# Patient Record
Sex: Male | Born: 1960 | Race: White | Hispanic: No | Marital: Married | Smoking: Never smoker
Health system: Southern US, Community
[De-identification: ages and names within clinical notes are randomized; demographics above are authoritative.]

## PROBLEM LIST (undated history)

## (undated) DIAGNOSIS — I10 Essential (primary) hypertension: Secondary | ICD-10-CM

## (undated) DIAGNOSIS — M1990 Unspecified osteoarthritis, unspecified site: Secondary | ICD-10-CM

## (undated) DIAGNOSIS — Z87442 Personal history of urinary calculi: Secondary | ICD-10-CM

## (undated) DIAGNOSIS — G4733 Obstructive sleep apnea (adult) (pediatric): Secondary | ICD-10-CM

## (undated) DIAGNOSIS — G4730 Sleep apnea, unspecified: Secondary | ICD-10-CM

## (undated) DIAGNOSIS — N200 Calculus of kidney: Secondary | ICD-10-CM

## (undated) DIAGNOSIS — K219 Gastro-esophageal reflux disease without esophagitis: Secondary | ICD-10-CM

## (undated) DIAGNOSIS — R931 Abnormal findings on diagnostic imaging of heart and coronary circulation: Secondary | ICD-10-CM

## (undated) HISTORY — PX: TONSILLECTOMY: SUR1361

## (undated) HISTORY — DX: Abnormal findings on diagnostic imaging of heart and coronary circulation: R93.1

## (undated) HISTORY — DX: Obstructive sleep apnea (adult) (pediatric): G47.33

## (undated) HISTORY — DX: Calculus of kidney: N20.0

---

## 2000-02-29 ENCOUNTER — Encounter: Admission: RE | Admit: 2000-02-29 | Discharge: 2000-02-29 | Payer: Self-pay | Admitting: Specialist

## 2000-02-29 ENCOUNTER — Encounter: Payer: Self-pay | Admitting: Specialist

## 2000-08-23 ENCOUNTER — Encounter: Payer: Self-pay | Admitting: Emergency Medicine

## 2000-08-23 ENCOUNTER — Emergency Department (HOSPITAL_COMMUNITY): Admission: EM | Admit: 2000-08-23 | Discharge: 2000-08-23 | Payer: Self-pay | Admitting: Emergency Medicine

## 2000-12-01 HISTORY — PX: OTHER SURGICAL HISTORY: SHX169

## 2000-12-16 ENCOUNTER — Inpatient Hospital Stay (HOSPITAL_COMMUNITY): Admission: RE | Admit: 2000-12-16 | Discharge: 2000-12-17 | Payer: Self-pay | Admitting: Orthopaedic Surgery

## 2010-12-01 ENCOUNTER — Emergency Department (HOSPITAL_COMMUNITY)
Admission: EM | Admit: 2010-12-01 | Discharge: 2010-12-01 | Payer: Self-pay | Source: Home / Self Care | Admitting: Emergency Medicine

## 2010-12-01 IMAGING — CT CT ABD-PELV W/O CM
2 of 4 series · 17 of 46 positions shown, 19 images · non-contrast
Comparison: None.

CLINICAL DATA: Left flank pain.  Radiates to the groin.  Nausea
vomiting.  History of kidney stones.

CT ABDOMEN AND PELVIS WITHOUT CONTRAST
TECHNIQUE: Multidetector CT imaging of the abdomen and pelvis was
performed following the standard protocol without intravenous
contrast.

[Series 2: stone_wo 5.0 b40f st · axial · 0.81mm/px · z∈[-543,-113]mm · 14 of 94 slices shown, 16 images]
[im 4/94  soft-tissue]
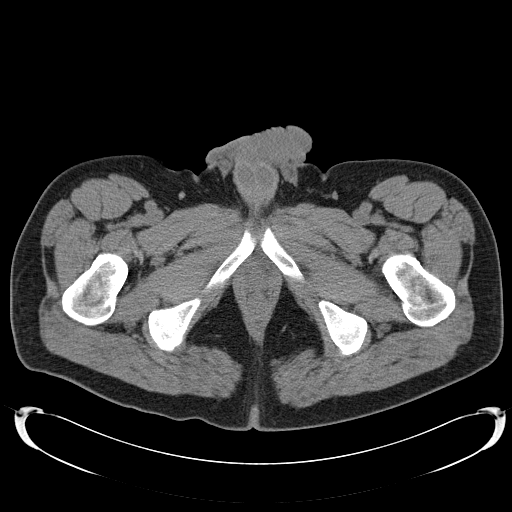
[im 4/94  bone]
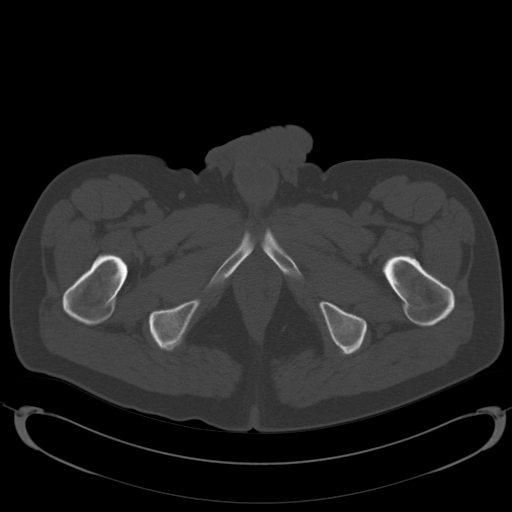
[im 12/94  soft-tissue]
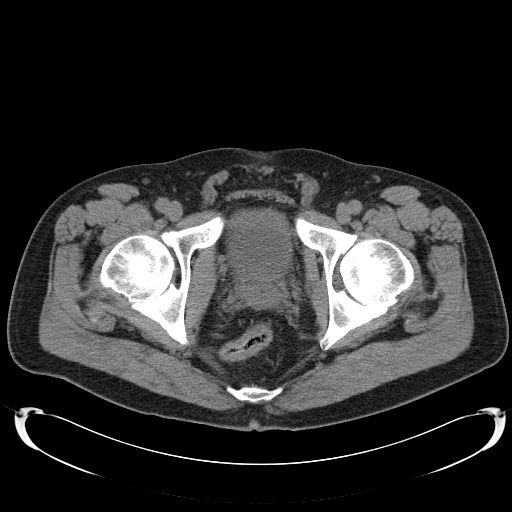
[im 20/94  soft-tissue]
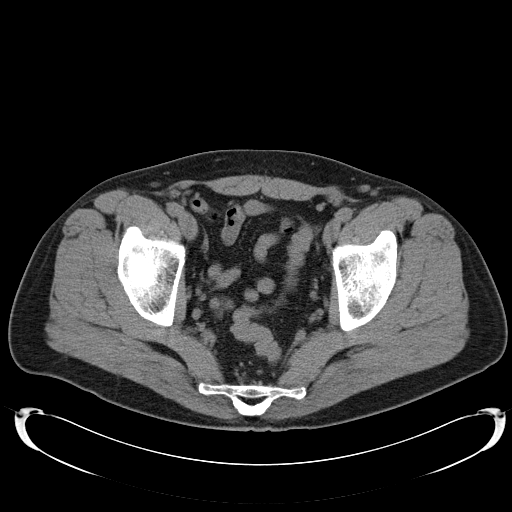
[im 24/94  soft-tissue]
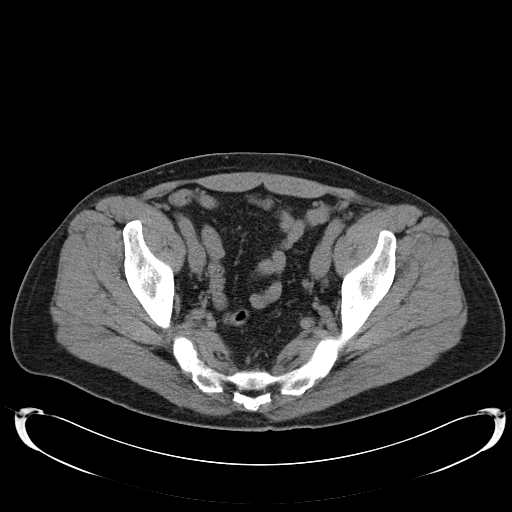
[im 32/94  soft-tissue]
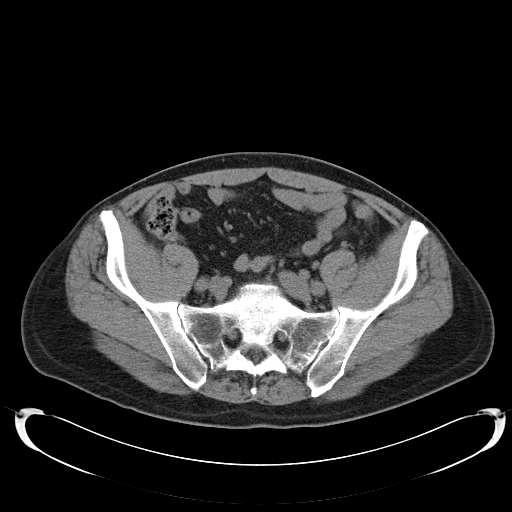
[im 39/94  soft-tissue]
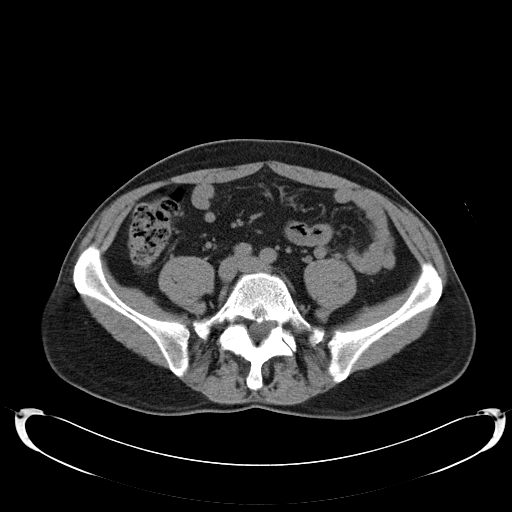
[im 43/94  soft-tissue]
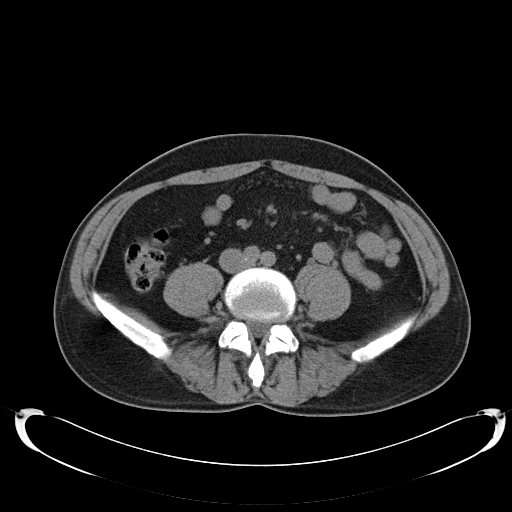
[im 51/94  soft-tissue]
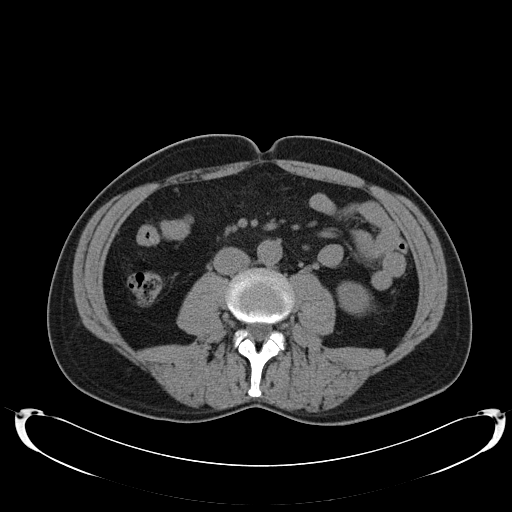
[im 55/94  soft-tissue]
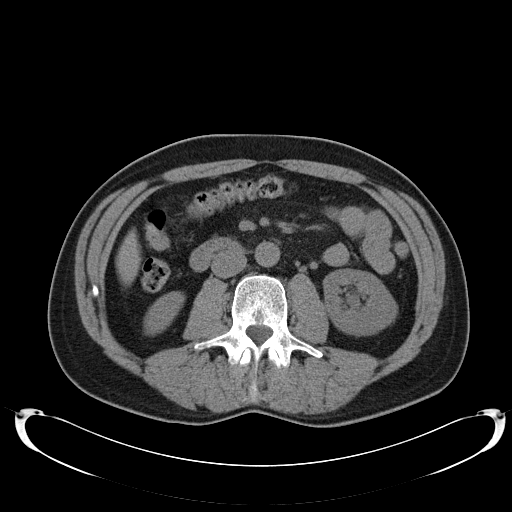
[im 55/94  bone]
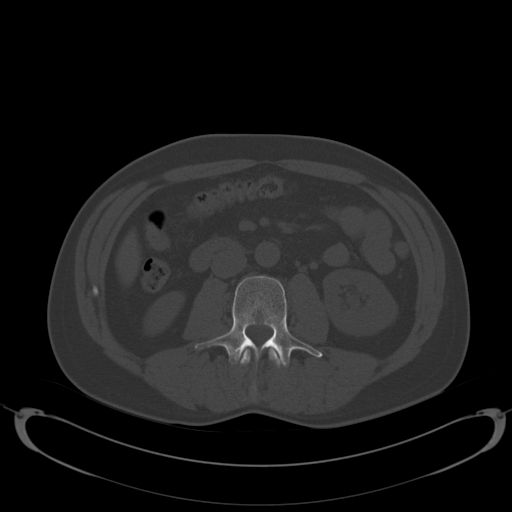
[im 63/94  soft-tissue]
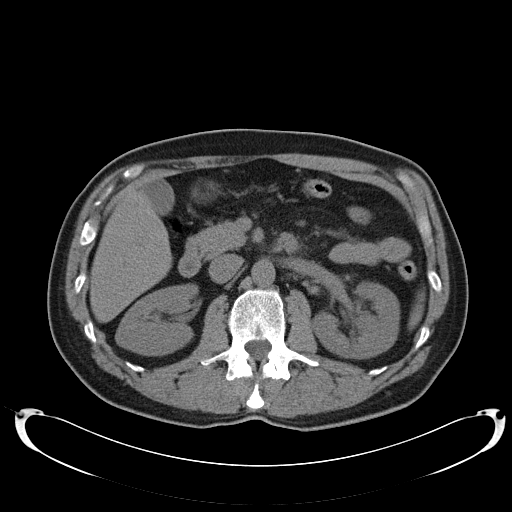
[im 70/94  soft-tissue]
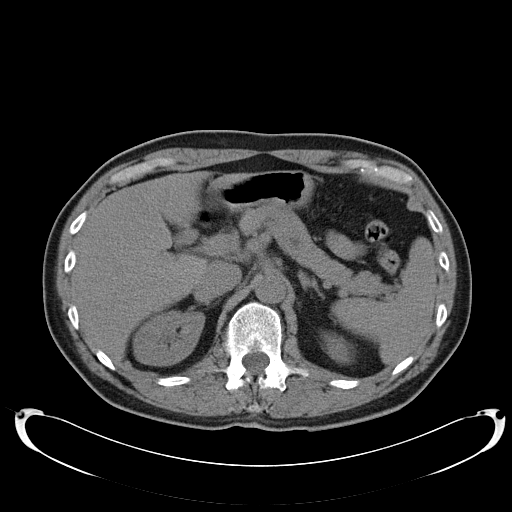
[im 74/94  soft-tissue]
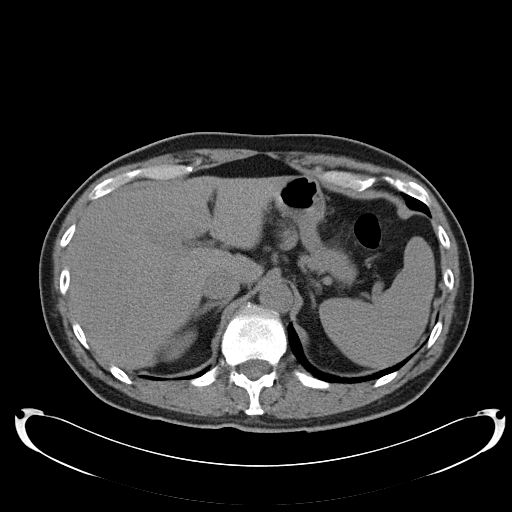
[im 82/94  soft-tissue]
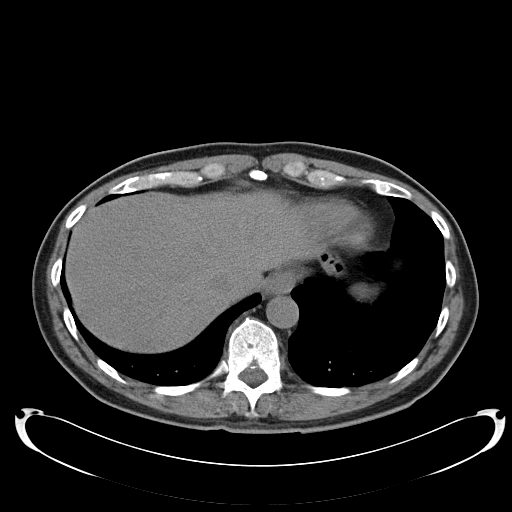
[im 90/94  soft-tissue]
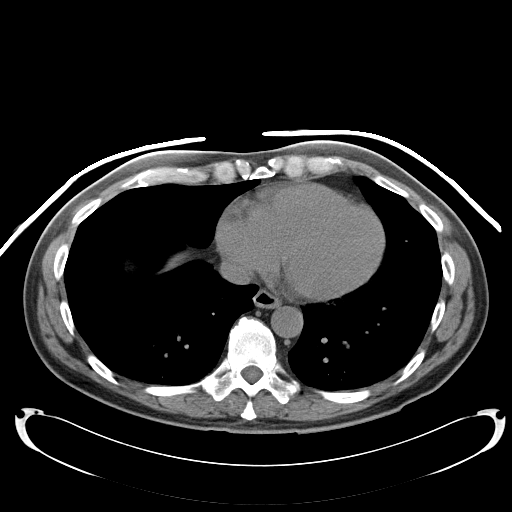

[Series 602: coronal abdomen · coronal · 0.95mm/px · 3 of 120 slices shown]
[im 40/120  soft-tissue]
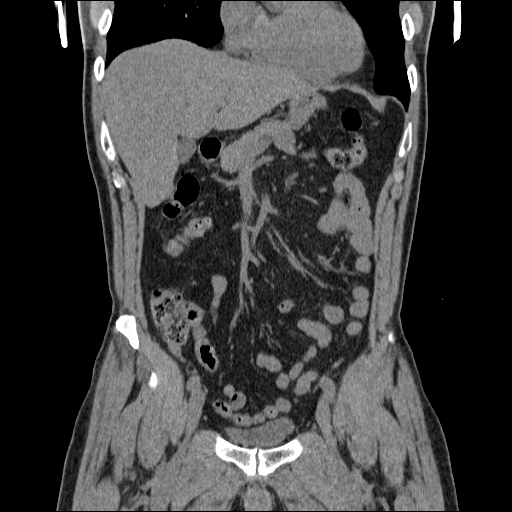
[im 53/120  soft-tissue]
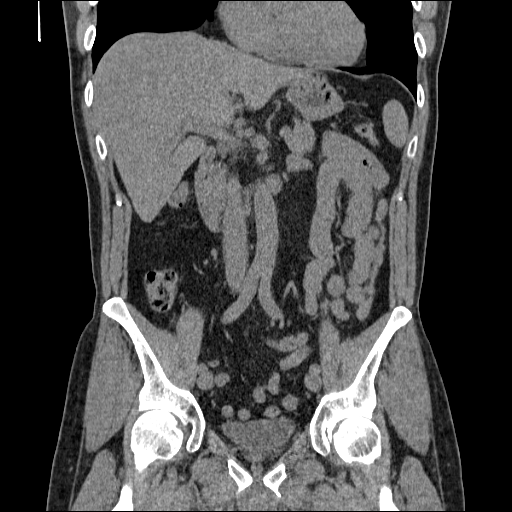
[im 67/120  soft-tissue]
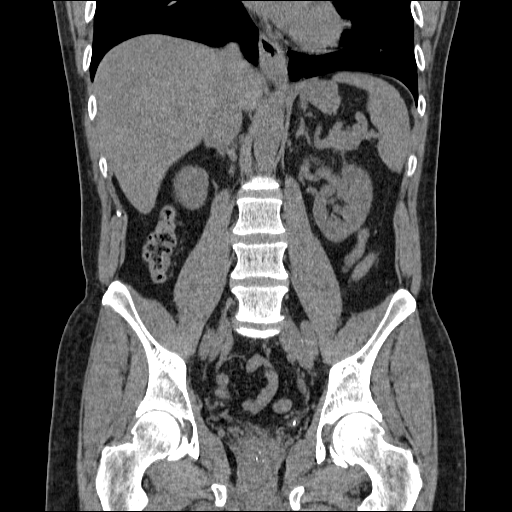

[17 of 46 positions shown; findings below may reference images not displayed]

FINDINGS: No focal abnormalities seen in the liver or spleen.  The
stomach, duodenum, pancreas, gallbladder, adrenal glands, and right
kidney are unremarkable.  Fullness is seen in the left intrarenal
collecting system and proximal left ureter.

No abdominal aortic aneurysm.  No free fluid in the abdomen.  No
evidence for abdominal lymphadenopathy.

Imaging through the pelvis shows a 3-4 mm stone in the distal left
ureter about 1.0 cm proximal to the UVJ.  No stones in the right
ureter.  No bladder stones.

There is no free fluid in the pelvis.  There is no pelvic sidewall
lymphadenopathy.  Prostate gland is mildly enlarged.  No
substantial diverticular change in the colon.  No evidence for
colonic diverticulitis.  The terminal ileum is normal.  The
appendix is normal.

Bone windows revealed no worrisome lytic or sclerotic bony
abnormality.
IMPRESSION: 3-4 mm distal left ureteral stone causes mild secondary changes in
the left kidney and ureter.

## 2011-02-10 LAB — URINE MICROSCOPIC-ADD ON

## 2011-02-10 LAB — URINALYSIS, ROUTINE W REFLEX MICROSCOPIC
Bilirubin Urine: NEGATIVE
Glucose, UA: NEGATIVE mg/dL
Ketones, ur: NEGATIVE mg/dL
Leukocytes, UA: NEGATIVE
Nitrite: NEGATIVE
Protein, ur: NEGATIVE mg/dL
Specific Gravity, Urine: 1.019 (ref 1.005–1.030)
Urobilinogen, UA: 0.2 mg/dL (ref 0.0–1.0)
pH: 5.5 (ref 5.0–8.0)

## 2011-02-10 LAB — DIFFERENTIAL
Basophils Absolute: 0 10*3/uL (ref 0.0–0.1)
Eosinophils Relative: 2 % (ref 0–5)
Lymphocytes Relative: 40 % (ref 12–46)
Lymphs Abs: 2.6 10*3/uL (ref 0.7–4.0)
Neutro Abs: 3 10*3/uL (ref 1.7–7.7)

## 2011-02-10 LAB — BASIC METABOLIC PANEL
Chloride: 103 mEq/L (ref 96–112)
Creatinine, Ser: 1.14 mg/dL (ref 0.4–1.5)
GFR calc Af Amer: >60 mL/min (ref >60)
GFR calc non Af Amer: >60 mL/min (ref >60)
Potassium: 3.8 mEq/L (ref 3.5–5.1)

## 2011-02-10 LAB — CBC
HCT: 44.2 % (ref 39.0–52.0)
MCV: 91.9 fL (ref 78.0–100.0)
Platelets: 203 10*3/uL (ref 150–400)
RBC: 4.81 MIL/uL (ref 4.22–5.81)
RDW: 11.6 % (ref 11.5–15.5)
WBC: 6.3 10*3/uL (ref 4.0–10.5)

## 2011-04-18 NOTE — Op Note (Signed)
[place]  Patient:    Bruce [name], Bruce [name]                        MRN: [id number] Proc. Date: 12/16/00 Adm. Date:  [id number] Disc. Date: [id number] Attending:  Shelba Flake                           Operative Report  PREOPERATIVE DIAGNOSIS:  L5-S1 herniated nucleus pulposus right.  POSTOPERATIVE DIAGNOSIS:  L5-S1 herniated nucleus pulposus right.  PROCEDURE:  Right L5 laminotomy and L5-S1 microdiskectomy.  SURGEON:  Mark C. Ophelia Charter, M.D.  ASSISTANT:  Colon Flattery. Ollen Bowl, M.D.  ANESTHESIA:  GOT.  DESCRIPTION OF PROCEDURE:  After induction of general anesthesia, orotracheal intubation, the patient was placed in the kneeling position on the [place] frame.  The back was prepped with Duraprep, and preoperative Ancef was given prophylactically.  After squaring the area with towels, Betadine Vi-Drape, laminectomy sheets, and drapes, a crosstable lateral x-ray confirmed that the needle was directly over the L5-S1 disk space.  Small incision was made just to the right of the midline adjacent to the spinous processes.  Subperiosteal dissection down to the interspinous space between the right L5 hemilamina and the S1 lamina was performed.  Taylor retractor was placed laterally with a five-pound weight.  Soft tissue was cleaned off with a cup curette. Ligamentum was incised with a 15 scalpel blade.  Ligamentum was removed.  Dura was then visualized.  Operative microscope was brought in.  The ligament was cleared from the lateral gutter.  Foramen was enlarged.  There was a disk that had a small amount of herniated nucleus material adjacent to an area of calcified disk, which was just to the right and on the edge of the shoulder of the nerve root.  The nerve root was gently retracted, and then the small puncture site with the extruded piece of disk was enlarged slightly and a moderate amount of nucleus disk material was removed.  The calcified fragment was  partially excised with a scalpel and then had to be totally removed with a medium 3 mm and 4 mm Kerrison.  Palpation showed no remaining areas of calcification of annulus or old extruded disk fragment.  Passes were made toward the midline with up- and straight pituitaries, down-biting pituitaries. The foramen was enlarged.  The nerve root was completely loose, and a hockey stick could be passed anterior to the dura and nerve root 180 degree sweep with no areas of compression.  The axilla of the nerve root was inspected and was loose and free and after irrigation with saline solution into the disk, the fascia was closed with 0 Vicryl, 2-0 Vicryl in the subcutaneous tissue, Marcaine infiltration in the skin, skin staple closure, and a postoperative dressing was applied, and instrument count and needle count was correct. DD:  12/16/00 TD:  12/16/00 Job: [id number]

## 2013-04-26 ENCOUNTER — Ambulatory Visit (INDEPENDENT_AMBULATORY_CARE_PROVIDER_SITE_OTHER): Payer: BC Managed Care – PPO | Admitting: Cardiovascular Disease

## 2013-04-26 ENCOUNTER — Encounter: Payer: Self-pay | Admitting: Cardiovascular Disease

## 2013-04-26 VITALS — BP 126/92 | HR 59 | Ht 77.0 in | Wt 217.1 lb

## 2013-04-26 DIAGNOSIS — E782 Mixed hyperlipidemia: Secondary | ICD-10-CM

## 2013-04-26 DIAGNOSIS — R5381 Other malaise: Secondary | ICD-10-CM

## 2013-04-26 DIAGNOSIS — R5383 Other fatigue: Secondary | ICD-10-CM

## 2013-04-26 DIAGNOSIS — E785 Hyperlipidemia, unspecified: Secondary | ICD-10-CM

## 2013-04-26 DIAGNOSIS — R0683 Snoring: Secondary | ICD-10-CM | POA: Insufficient documentation

## 2013-04-26 DIAGNOSIS — Z8249 Family history of ischemic heart disease and other diseases of the circulatory system: Secondary | ICD-10-CM

## 2013-04-26 DIAGNOSIS — R0989 Other specified symptoms and signs involving the circulatory and respiratory systems: Secondary | ICD-10-CM

## 2013-04-26 DIAGNOSIS — Z13828 Encounter for screening for other musculoskeletal disorder: Secondary | ICD-10-CM

## 2013-04-26 NOTE — Progress Notes (Signed)
Patient ID: Bruce [name], male   DOB: November 08, 1961, 52 y.o.   MRN: [id number] PATIENT PROFILE: Bruce [name] is a 52 year old male whose mother suffered a myocardial infarction one month ago. Patient has a significant family history for premature coronary artery disease. The patient now presents for establishment of cardiology care and screening assessment.   HPI: Bruce [name] denies any known coronary artery disease or episodes of chest pain. He states that over the years he has had slight progression of his cholesterol and has consistently had low good cholesterol levels. He is followed by Dr. Judithann Sauger and his last laboratory August 2013 showed a total cholesterol 178, triglycerides 196, HDL cholesterol 33, non-HDL cholesterol 145 and LDL cholesterol 113. He is never been on lipid lowering therapy and currently does not take medications with the exception of aspirin and over-the-counter fish oil. With his mother having a myocardial infarction one month ago, and strong family history for coronary artery disease he now presents to establish cardiological care and further evaluation.  Past Medical History  Diagnosis Date  . Kidney stones     Past Surgical History  Procedure Laterality Date  . Ruptured disc  2002    Dr. Jabier Gauss    No Known Allergies  Current Outpatient Prescriptions  Medication Sig Dispense Refill  . aspirin 81 MG tablet Take 81 mg by mouth daily.      . fish oil-omega-3 fatty acids 1000 MG capsule Take by mouth daily. 1200 MG CAPS. Takes 5 daily.       No current facility-administered medications for this visit.    Socially, he completed 12th grade education. He is married for 33 years the he works for [employer]. 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 He exercises approximately 2 times per week doing he routinely does not do cardiac or aerobic workouts. He's never smoked tobacco. He denies alcohol use.  Family History  Problem Relation Age of  Onset  . Heart attack Mother   . Hyperlipidemia Mother   . Hypertension Mother   . Heart attack Father   . Hyperlipidemia Father   . Colon cancer Maternal Grandmother   . Heart attack Paternal Grandmother   . Heart disease Paternal Grandmother   . Heart attack Paternal Grandfather   . Heart disease Paternal Grandfather     ROV is negative for fever chills malaise night sweats. He denies palpitations. He denies presyncope or syncope. He denies wheezing. He denies chest pressure per time as there is some very mild shortness of breath. He denies abdominal pain. He denies change in bowel or bladder habits. He denies claudication symptoms. Patient does admit to episodes of snoring he states according to his wife his snoring has gotten worse over the last 5 years. At times he has awakened with a gasp. He had has a brother who utilizes CPAP and also his father has sleep apnea. He denies neurologic symptoms. At times he does note some fatigue.  PE BP 126/92  [name] 57  Ht 6\' 5"  (1.956 m)  Wt 217 lb 1.6 oz (98.476 kg)  BMI 25.74 kg/m2 General: Alert, oriented, no distress.  HEENT: Normocephalic, atraumatic. Pupils round and reactive; sclera anicteric; Fundi normal Nose without nasal septal hypertrophy Mouth/Parynx benign; Mallinpatti scale 3 Neck: No JVD, no carotid briuts Lungs: clear to ausculatation and percussion; no wheezing or rales Chest wall with pectus excavatum deformity Heart: RRR, s1 s2 normal, intermittent click, whiff of a systolic murmur  Abdomen: soft, nontender; no hepatosplenomehaly, BS+; abdominal aorta nontender and not dilated by palpation. Pulses 2+ Extremities: no clubbinbg cyanosis or edema, Homan's sign negative  Neurologic: grossly nonfocal    ECG: Sinus bradycardia at 59 beats per minute. PR interval 178 ms, QTc interval 403 ms.  ASSESSMENT AND PLAN: My impression is that Bruce [name] is a 52 year old who does a family history for coronary artery disease with both  parents having had heart attacks. His father suffered his initial heart attack at age 27 and subsequently did undergo CABG surgery x3 and also has stents as well as a defibrillator. Bruce [name]  is tall and lean and his exam does raise the possibility of perhaps mitral valve prolapse. He does exercise several days per week with weights. I am recommending that we obtain a 2-D echo Doppler study to evaluate for any structural heart disease and particularly to evaluate his mitral valve. I'm recommending that he undergo a routine treadmill test or screening purposes in light of his premature family history for coronary artery disease. He does have a history of low HDL levels and his last LDL cholesterol was 113 ; I'm recommending that we undergo an NMR lipoprotein to asses LDL particle number as well as HDL particle number for more aggressive management and potential treatment. In light of his progressive snoring and family history for sleep apnea with several episodes of awakening abruptly I am recommending that he undergo a diagnostic polysomnogram to evaluate for obstructive sleep apnea. I scheduled him to undergo complete set of laboratory consisting of a CBC, comprehensive metabolic panel, TSH, lipoprotein of with lipid panel, will also obtain a vitamin D level as well as a C. reactive protein. I will see him back in the office in followup of the above studies.    Lennette Bihari, MD, [facility] 04/26/2013 2:58 PM

## 2013-04-26 NOTE — Patient Instructions (Addendum)
  Your physician has requested that you have an echocardiogram. Echocardiography is a painless test that uses sound waves to create images of your heart. It provides your doctor with information about the size and shape of your heart and how well your heart's chambers and valves are working. This procedure takes approximately one hour. There are no restrictions for this procedure.  Your physician has requested that you have an exercise tolerance test. For further information please visit [email or web address] Please also follow instruction sheet, as given.   Your physician recommends that you return for lab work.  Your physician has recommended that you have a sleep study. This test records several body functions during sleep, including: brain activity, eye movement, oxygen and carbon dioxide blood levels, heart rate and rhythm, breathing rate and rhythm, the flow of air through your mouth and nose, snoring, body muscle movements, and chest and belly movement.   Your physician recommends that you schedule a follow-up appointment in: 4-6 WEEKS.

## 2013-04-27 ENCOUNTER — Encounter: Payer: Self-pay | Admitting: Cardiovascular Disease

## 2013-05-17 ENCOUNTER — Ambulatory Visit (HOSPITAL_COMMUNITY)
Admission: RE | Admit: 2013-05-17 | Discharge: 2013-05-17 | Disposition: A | Discharge status: Home / Self Care | Payer: BC Managed Care – PPO | Source: Ambulatory Visit | Attending: Cardiovascular Disease | Admitting: Cardiovascular Disease

## 2013-05-17 ENCOUNTER — Other Ambulatory Visit: Payer: Self-pay

## 2013-05-17 DIAGNOSIS — Z8249 Family history of ischemic heart disease and other diseases of the circulatory system: Secondary | ICD-10-CM | POA: Insufficient documentation

## 2013-05-17 DIAGNOSIS — E782 Mixed hyperlipidemia: Secondary | ICD-10-CM | POA: Insufficient documentation

## 2013-05-17 DIAGNOSIS — E785 Hyperlipidemia, unspecified: Secondary | ICD-10-CM

## 2013-05-17 NOTE — Progress Notes (Signed)
[facility]   2D echo completed 05/17/2013.   Cindy Fatoumata Albaugh, RDCS  

## 2013-05-25 LAB — COMPREHENSIVE METABOLIC PANEL
Albumin: 4.4 g/dL (ref 3.5–5.2)
BUN: 23 mg/dL (ref 6–23)
CO2: 29 mEq/L (ref 19–32)
Calcium: 9.4 mg/dL (ref 8.4–10.5)
Chloride: 101 mEq/L (ref 96–112)
Creat: 1.09 mg/dL (ref 0.50–1.35)
Glucose, Bld: 102 mg/dL — ABNORMAL HIGH (ref 70–99)
Potassium: 4.7 mEq/L (ref 3.5–5.3)

## 2013-05-25 LAB — CBC
HCT: 46.7 % (ref 39.0–52.0)
Hemoglobin: 16.4 g/dL (ref 13.0–17.0)
MCV: 89.8 fL (ref 78.0–100.0)
RDW: 12.8 % (ref 11.5–15.5)
WBC: 5.7 10*3/uL (ref 4.0–10.5)

## 2013-05-25 LAB — TSH: TSH: 2.477 u[IU]/mL (ref 0.350–4.500)

## 2013-05-26 LAB — NMR LIPOPROFILE WITH LIPIDS
HDL Size: 8.6 nm — ABNORMAL LOW (ref >=9.2)
HDL-C: 33 mg/dL — ABNORMAL LOW (ref >=40)
LDL (calc): 97 mg/dL (ref <100)
LDL Particle Number: 1688 nmol/L — ABNORMAL HIGH (ref <1000)
LDL Size: 19.7 nm — ABNORMAL LOW (ref >20.5)
LP-IR Score: 51 — ABNORMAL HIGH (ref <=45)
Large HDL-P: <1.3 umol/L — ABNORMAL LOW (ref >=4.8)
Triglycerides: 179 mg/dL — ABNORMAL HIGH (ref <150)
VLDL Size: 43 nm (ref <=46.6)

## 2013-05-26 LAB — VITAMIN D 25 HYDROXY (VIT D DEFICIENCY, FRACTURES): Vit D, 25-Hydroxy: 34 ng/mL (ref 30–89)

## 2013-06-08 ENCOUNTER — Encounter: Payer: Self-pay | Admitting: *Deleted

## 2013-06-08 ENCOUNTER — Telehealth: Payer: Self-pay | Admitting: Cardiovascular Disease

## 2013-06-08 MED ORDER — ROSUVASTATIN CALCIUM 20 MG PO TABS: 20.0000 mg | ORAL_TABLET | Freq: Every day | ORAL | Status: DC

## 2013-06-08 NOTE — Progress Notes (Signed)
Patient informed of lab results per Dr. Tresa Endo. Start Crestor 20 mg. Increase fish oil to 2 caps daily.

## 2013-06-08 NOTE — Telephone Encounter (Signed)
Returning call! Thinks it might be concerning test results!

## 2013-06-08 NOTE — Progress Notes (Signed)
Quick Note:  Patient informed of lab results. Per Dr.Kelly to start Crestor 20 mg and increase fish oil to 2 tablets daily. [facility]restor sent to [facility] [place]. ______

## 2013-06-13 ENCOUNTER — Other Ambulatory Visit: Payer: Self-pay | Admitting: *Deleted

## 2013-06-17 ENCOUNTER — Telehealth: Payer: Self-pay | Admitting: Cardiovascular Disease

## 2013-06-17 NOTE — Telephone Encounter (Signed)
Mr. Navarro states that he saw Dr. Tresa Endo and was given a new rx for Crestor.  [facility] [place] [place]  Is stating there is an issue with his insurance.  Please advise.

## 2013-06-17 NOTE — Telephone Encounter (Signed)
Bruce [name] is working on Bruce [name] [employer]

## 2013-06-21 ENCOUNTER — Encounter: Payer: Self-pay | Admitting: *Deleted

## 2013-06-21 MED ORDER — ROSUVASTATIN CALCIUM 20 MG PO TABS: 20.0000 mg | ORAL_TABLET | Freq: Every day | ORAL | Status: DC

## 2013-06-21 NOTE — Telephone Encounter (Signed)
Spoke w/ Nya, MA and PA is not in progress w/ this pt.  Message forwarded to W. Waddell, CMA.  Returned call to pt.  Pt stated the pharmacy told him his insurance didn't cover the Crestor.  Pt informed PA will be initiated and in the meantime he can come pick up samples so that he can start.  Pt agreed w/ plan and understands he will be contacted after response given from PA.  Samples left at front desk for pt pick up.  Lot: [id number] Exp: 01/2016.  PA will need to be completed.

## 2013-06-21 NOTE — Telephone Encounter (Signed)
Still waiting to get approval for another medicine instead of Crestor.

## 2013-06-28 ENCOUNTER — Telehealth: Payer: Self-pay | Admitting: Cardiovascular Disease

## 2013-06-28 NOTE — Telephone Encounter (Signed)
Prior authorization needed for Crestor please.

## 2013-07-01 ENCOUNTER — Other Ambulatory Visit: Payer: Self-pay | Admitting: *Deleted

## 2013-07-01 NOTE — Telephone Encounter (Signed)
Phoned patient to ask if he has ever taken a cholesterol lowering medication in the past. He states that he has not. i will need to notify Dr. Tresa Endo of this. His insurance company requires that he try and fail a different generic statin. Patient agreed with this. States that he cannot afford to pay $200/mo for the crestor.

## 2013-07-04 ENCOUNTER — Telehealth: Payer: Self-pay | Admitting: *Deleted

## 2013-07-04 MED ORDER — ATORVASTATIN CALCIUM 40 MG PO TABS: 40.0000 mg | ORAL_TABLET | Freq: Every day | ORAL | Status: DC

## 2013-07-04 NOTE — Telephone Encounter (Signed)
Insurance company would not cover Crestor 20 mg. Per V.O. From Dr. Tresa Endo to change medication to atorvastatin 40 mg. RX sent to patients pharmacy  Patient notified of the medication change and to pick up new prescription at his pharmacy.

## 2014-02-13 ENCOUNTER — Other Ambulatory Visit: Payer: Self-pay | Admitting: *Deleted

## 2014-02-13 MED ORDER — ATORVASTATIN CALCIUM 40 MG PO TABS: 40.0000 mg | ORAL_TABLET | Freq: Every day | ORAL | Status: DC

## 2014-02-13 NOTE — Telephone Encounter (Signed)
Rx was sent to pharmacy electronically. 

## 2014-03-16 ENCOUNTER — Encounter: Payer: Self-pay | Admitting: Cardiology

## 2014-03-16 ENCOUNTER — Ambulatory Visit (INDEPENDENT_AMBULATORY_CARE_PROVIDER_SITE_OTHER): Payer: BC Managed Care – PPO | Admitting: Cardiology

## 2014-03-16 VITALS — BP 130/90 | HR 72 | Ht 77.0 in | Wt 225.0 lb

## 2014-03-16 DIAGNOSIS — R0683 Snoring: Secondary | ICD-10-CM

## 2014-03-16 DIAGNOSIS — R0609 Other forms of dyspnea: Secondary | ICD-10-CM

## 2014-03-16 DIAGNOSIS — Z8249 Family history of ischemic heart disease and other diseases of the circulatory system: Secondary | ICD-10-CM

## 2014-03-16 DIAGNOSIS — E785 Hyperlipidemia, unspecified: Secondary | ICD-10-CM

## 2014-03-16 DIAGNOSIS — Z79899 Other long term (current) drug therapy: Secondary | ICD-10-CM

## 2014-03-16 DIAGNOSIS — R0989 Other specified symptoms and signs involving the circulatory and respiratory systems: Secondary | ICD-10-CM

## 2014-03-16 LAB — COMPREHENSIVE METABOLIC PANEL
ALT: 51 U/L (ref 0–53)
AST: 31 U/L (ref 0–37)
Albumin: 4.5 g/dL (ref 3.5–5.2)
Alkaline Phosphatase: 53 U/L (ref 39–117)
BUN: 15 mg/dL (ref 6–23)
CO2: 30 mEq/L (ref 19–32)
Calcium: 9.3 mg/dL (ref 8.4–10.5)
Chloride: 101 mEq/L (ref 96–112)
Creat: 1.03 mg/dL (ref 0.50–1.35)
Glucose, Bld: 106 mg/dL — ABNORMAL HIGH (ref 70–99)
Potassium: 4.3 mEq/L (ref 3.5–5.3)
Sodium: 140 mEq/L (ref 135–145)
Total Bilirubin: 0.8 mg/dL (ref 0.2–1.2)
Total Protein: 7 g/dL (ref 6.0–8.3)

## 2014-03-16 LAB — LIPID PANEL
Cholesterol: 87 mg/dL (ref 0–200)
HDL: 30 mg/dL — ABNORMAL LOW (ref >39)
LDL Cholesterol: 37 mg/dL (ref 0–99)
Total CHOL/HDL Ratio: 2.9 Ratio
Triglycerides: 98 mg/dL (ref <150)
VLDL: 20 mg/dL (ref 0–40)

## 2014-03-16 NOTE — Progress Notes (Signed)
    03/16/2014 Bruce [name]   05-03-1961  [id number]  Primary Physicia Bruce AschoffOSS,ALLAN, MD Primary Cardiologist: Dr Tresa EndoKelly  HPI: [name] is a 53 year old male whose mother suffered a myocardial infarction one month ago. Patient has a significant family history for premature coronary artery disease. He had a low risk Myoview and normal echo. He went to refill his Lipitor and was told he would need an office visit. He denies any chest pain or DOE.      Current Outpatient Prescriptions  Medication Sig Dispense Refill  . aspirin 81 MG tablet Take 81 mg by mouth daily.      [name]. atorvastatin (LIPITOR) 40 MG tablet Take 1 tablet (40 mg total) by mouth daily.  30 tablet  2  . co-enzyme Q-10 30 MG capsule Take 100 mg by mouth 3 (three) times daily.      . fish oil-omega-3 fatty acids 1000 MG capsule Take by mouth daily. 1200 MG CAPS. Takes 5 daily.       No current facility-administered medications for this visit.    No Known Allergies  History   Social History  . Marital Status: Single    Spouse Name: N/A    Number of Children: N/A  . Years of Education: N/A   Occupational History  . Not on file.   Social History Main Topics  . Smoking status: Never Smoker   . Smokeless tobacco: Not on file  . Alcohol Use: Not on file  . Drug Use: Not on file  . Sexual Activity: Not on file   Other Topics Concern  . Not on file   Social History Narrative  . No narrative on file     Review of Systems: General: negative for chills, fever, night sweats or weight changes.  Cardiovascular: negative for chest pain, dyspnea on exertion, edema, orthopnea, palpitations, paroxysmal nocturnal dyspnea or shortness of breath Dermatological: negative for rash Respiratory: negative for cough or wheezing Urologic: negative for hematuria Abdominal: negative for nausea, vomiting, diarrhea, bright red blood per rectum, melena, or hematemesis Neurologic: negative for visual changes, syncope, or  dizziness All other systems reviewed and are otherwise negative except as noted above.    Blood pressure 130/90, pulse 72, height 6\' 5"  (1.956 m), weight 225 lb (102.059 kg).  General appearance: alert, cooperative and no distress Neck: no carotid bruit and no JVD Lungs: clear to auscultation bilaterally and mild pectus Heart: regular rate and rhythm  EKG NSR  ASSESSMENT AND PLAN:   Family history of premature CAD .  Hyperlipidemia LDL goal < 100 .  Snoring He never had sleep study   PLAN  He is due for Lipids and Cmet in June. He can follow up with Dr Tresa EndoKelly in a year.   Eda PaschalLuke K [facility] [name] 03/16/2014 1:32 PM

## 2014-03-16 NOTE — Patient Instructions (Signed)
Your physician recommends that you return for lab work  Your physician recommends that you schedule a follow-up appointment in: one year with Dr Tresa EndoKelly

## 2014-03-16 NOTE — Assessment & Plan Note (Signed)
He never had sleep study

## 2014-03-20 ENCOUNTER — Other Ambulatory Visit: Payer: Self-pay | Admitting: *Deleted

## 2014-03-20 MED ORDER — ATORVASTATIN CALCIUM 20 MG PO TABS: 20.0000 mg | ORAL_TABLET | Freq: Every day | ORAL | Status: DC

## 2014-10-10 ENCOUNTER — Other Ambulatory Visit: Payer: Self-pay | Admitting: Cardiovascular Disease

## 2014-10-10 NOTE — Telephone Encounter (Signed)
Rx was sent to pharmacy electronically. 

## 2015-01-10 ENCOUNTER — Telehealth: Payer: Self-pay | Admitting: Cardiovascular Disease

## 2015-01-22 NOTE — Telephone Encounter (Signed)
Closed encounter °

## 2015-02-16 ENCOUNTER — Telehealth: Payer: Self-pay | Admitting: Cardiovascular Disease

## 2015-02-19 NOTE — Telephone Encounter (Signed)
Closed encounter °

## 2015-03-22 ENCOUNTER — Ambulatory Visit: Payer: Self-pay | Admitting: Cardiovascular Disease

## 2015-03-26 ENCOUNTER — Ambulatory Visit: Payer: Self-pay | Admitting: Cardiovascular Disease

## 2015-04-14 ENCOUNTER — Other Ambulatory Visit: Payer: Self-pay | Admitting: Cardiovascular Disease

## 2015-04-16 NOTE — Telephone Encounter (Signed)
Rx has been sent to the pharmacy electronically. ° °

## 2015-05-16 ENCOUNTER — Encounter: Payer: Self-pay | Admitting: Cardiovascular Disease

## 2015-05-16 ENCOUNTER — Ambulatory Visit (INDEPENDENT_AMBULATORY_CARE_PROVIDER_SITE_OTHER): Payer: BLUE CROSS/BLUE SHIELD | Admitting: Cardiovascular Disease

## 2015-05-16 VITALS — BP 138/98 | HR 68 | Ht 77.0 in | Wt 221.5 lb

## 2015-05-16 DIAGNOSIS — Z8249 Family history of ischemic heart disease and other diseases of the circulatory system: Secondary | ICD-10-CM | POA: Diagnosis not present

## 2015-05-16 DIAGNOSIS — E785 Hyperlipidemia, unspecified: Secondary | ICD-10-CM | POA: Diagnosis not present

## 2015-05-16 DIAGNOSIS — Z79899 Other long term (current) drug therapy: Secondary | ICD-10-CM

## 2015-05-16 DIAGNOSIS — R0683 Snoring: Secondary | ICD-10-CM

## 2015-05-16 DIAGNOSIS — G4719 Other hypersomnia: Secondary | ICD-10-CM

## 2015-05-16 NOTE — Patient Instructions (Signed)
Your physician has recommended that you have a sleep study. This test records several body functions during sleep, including: brain activity, eye movement, oxygen and carbon dioxide blood levels, heart rate and rhythm, breathing rate and rhythm, the flow of air through your mouth and nose, snoring, body muscle movements, and chest and belly movement. This will be done at [facility] long sleep center . They will call you with the appointment information.  Your physician recommends that you return for lab work fasting.  Your physician discussed the importance of regular exercise and recommended that you start or continue a regular exercise program for good health.  Low-Sodium Eating Plan Sodium raises blood pressure and causes water to be held in the body. Getting less sodium from food will help lower your blood pressure, reduce any swelling, and protect your heart, liver, and kidneys. We get sodium by adding salt (sodium chloride) to food. Most of our sodium comes from canned, boxed, and frozen foods. Restaurant foods, fast foods, and pizza are also very high in sodium. Even if you take medicine to lower your blood pressure or to reduce fluid in your body, getting less sodium from your food is important. WHAT IS MY PLAN? Most people should limit their sodium intake to 2,300 mg a day. Your health care provider recommends that you limit your sodium intake to __________ a day.  WHAT DO I NEED TO KNOW ABOUT THIS EATING PLAN? For the low-sodium eating plan, you will follow these general guidelines:  Choose foods with a % Daily Value for sodium of less than 5% (as listed on the food label).   Use salt-free seasonings or herbs instead of table salt or sea salt.   Check with your health care provider or pharmacist before using salt substitutes.   Eat fresh foods.  Eat more vegetables and fruits.  Limit canned vegetables. If you do use them, rinse them well to decrease the sodium.   Limit cheese to 1  oz (28 g) per day.   Eat lower-sodium products, often labeled as "lower sodium" or "no salt added."  Avoid foods that contain monosodium glutamate (MSG). MSG is sometimes added to [place] food and some canned foods.  Check food labels (Nutrition Facts labels) on foods to learn how much sodium is in one serving.  Eat more home-cooked food and less restaurant, buffet, and fast food.  When eating at a restaurant, ask that your food be prepared with less salt or none, if possible.  HOW DO I READ FOOD LABELS FOR SODIUM INFORMATION? The Nutrition Facts label lists the amount of sodium in one serving of the food. If you eat more than one serving, you must multiply the listed amount of sodium by the number of servings. Food labels may also identify foods as:  Sodium free--Less than 5 mg in a serving.  Very low sodium--35 mg or less in a serving.  Low sodium--140 mg or less in a serving.  Light in sodium--50% less sodium in a serving. For example, if a food that usually has 300 mg of sodium is changed to become light in sodium, it will have 150 mg of sodium.  Reduced sodium--25% less sodium in a serving. For example, if a food that usually has 400 mg of sodium is changed to reduced sodium, it will have 300 mg of sodium. WHAT FOODS CAN I EAT? Grains Low-sodium cereals, including oats, puffed wheat and rice, and shredded wheat cereals. Low-sodium crackers. Unsalted rice and pasta. Lower-sodium bread.  Vegetables  Frozen or fresh vegetables. Low-sodium or reduced-sodium canned vegetables. Low-sodium or reduced-sodium tomato sauce and paste. Low-sodium or reduced-sodium tomato and vegetable juices.  Fruits Fresh, frozen, and canned fruit. Fruit juice.  Meat and Other Protein Products Low-sodium canned tuna and salmon. Fresh or frozen meat, poultry, seafood, and fish. Lamb. Unsalted nuts. Dried beans, peas, and lentils without added salt. Unsalted canned beans. Homemade soups without salt.  Eggs.  Dairy Milk. Soy milk. Ricotta cheese. Low-sodium or reduced-sodium cheeses. Yogurt.  Condiments Fresh and dried herbs and spices. Salt-free seasonings. Onion and garlic powders. Low-sodium varieties of mustard and ketchup. Lemon juice.  Fats and Oils Reduced-sodium salad dressings. Unsalted butter.  Other Unsalted popcorn and pretzels.  The items listed above may not be a complete list of recommended foods or beverages. Contact your dietitian for more options. WHAT FOODS ARE NOT RECOMMENDED? Grains Instant hot cereals. Bread stuffing, pancake, and biscuit mixes. Croutons. Seasoned rice or pasta mixes. Noodle soup cups. Boxed or frozen macaroni and cheese. Self-rising flour. Regular salted crackers. Vegetables Regular canned vegetables. Regular canned tomato sauce and paste. Regular tomato and vegetable juices. Frozen vegetables in sauces. Salted french fries. Olives. Rosita Fire. Relishes. Sauerkraut. Salsa. Meat and Other Protein Products Salted, canned, smoked, spiced, or pickled meats, seafood, or fish. Bacon, ham, sausage, hot dogs, corned beef, chipped beef, and packaged luncheon meats. Salt pork. Jerky. Pickled herring. Anchovies, regular canned tuna, and sardines. Salted nuts. Dairy Processed cheese and cheese spreads. Cheese curds. Blue cheese and cottage cheese. Buttermilk.  Condiments Onion and garlic salt, seasoned salt, table salt, and sea salt. Canned and packaged gravies. Worcestershire sauce. Tartar sauce. Barbecue sauce. Teriyaki sauce. Soy sauce, including reduced sodium. Steak sauce. Fish sauce. Oyster sauce. Cocktail sauce. Horseradish. Regular ketchup and mustard. Meat flavorings and tenderizers. Bouillon cubes. Hot sauce. Tabasco sauce. Marinades. Taco seasonings. Relishes. Fats and Oils Regular salad dressings. Salted butter. Margarine. Ghee. Bacon fat.  Other Potato and tortilla chips. Corn chips and puffs. Salted popcorn and pretzels. Canned or dried  soups. Pizza. Frozen entrees and pot pies.  The items listed above may not be a complete list of foods and beverages to avoid. Contact your dietitian for more information. Document Released: 05/09/2002 Document Revised: 11/22/2013 Document Reviewed: 09/21/2013 [facility] Patient Information 2015 [place], [place]. This information is not intended to replace advice given to you by your health care provider. Make sure you discuss any questions you have with your health care provider.  Your physician recommends that you schedule a follow-up appointment in: 3 months.

## 2015-05-18 ENCOUNTER — Encounter: Payer: Self-pay | Admitting: Cardiovascular Disease

## 2015-05-18 NOTE — Progress Notes (Signed)
Patient ID: Bruce [name], male   DOB: 1961-06-19, 54 y.o.   MRN: [id number]    HPI:  [name] is a 54 year old male who I had seen 2 years ago for cardiology evaluation after his mother suffered a heart attack and he presented for cardiology assessment.  HPI: [name] denies any known CAD or episodes of chest pain. He states that over the years he has had slight progression of  cholesterol elevation but has consistently had low good cholesterol levels. He is followed by Dr. Judithann Sauger.  Laboratory August 2013 showed a total cholesterol 178, triglycerides 196, HDL cholesterol 33, non-HDL cholesterol 145 and LDL cholesterol 113. He had never been on lipid lowering therapy and at that time did not take any medications with the exception of aspirin and over-the-counter fish oil.    In 2014, his mother suffered a myocardial infarction.  I saw the patient one month later for cardiology evaluation.  At that time, I recommended that he undergo a routine treadmill test.  This was troponin is being a negative, adequate Bruce treadmill test and he was able to exercise for 13.7 that workload and achieving AP, rate of 102% of age-predicted maximum heart rate.  He did not have diagnostic ST changes of ischemia.  He underwent an echo Doppler study on 05/17/2013 which revealed normal systolic function.  There was grade 1 diastolic dysfunction.  He had mild thickening of his mitral valve with systolic bowing without definitive prolapse.  Over the past several years, he has continued to be active.  He denies any exertional precipitation of chest pain.  He denies any change in exercise tolerance.  However, recently he has noticed significant reduction in his ability to sleep well.  He reportedly is snoring loudly.  His sleep is nonrestorative.  He states that if given the opportunity every day at 3 PM he would be able to sleep very well.  He typically goes to bed between 10 and 11 PM and wakes up between 6 and  6:30 AM.  He presents for evaluation.  Since I last saw him, he was started on lipid-lowering therapy and now takes atorvastatin 20 mg daily in addition to fish oil and coenzyme Q10.  He takes a baby aspirin.   Past Medical History  Diagnosis Date  . Kidney stones     Past Surgical History  Procedure Laterality Date  . Ruptured disc  2002    Dr. Jabier Gauss    No Known Allergies  Current Outpatient Prescriptions  Medication Sig Dispense Refill  . aspirin 81 MG tablet Take 81 mg by mouth daily.    [name] atorvastatin (LIPITOR) 20 MG tablet TAKE 1 TABLET BY MOUTH EVERY DAY 30 tablet 1  . Coenzyme Q10 (EQL COQ10) 300 MG CAPS Take 1 capsule by mouth daily.    . fish oil-omega-3 fatty acids 1000 MG capsule Take by mouth daily. 1200 MG CAPS. Takes 5 daily.     No current facility-administered medications for this visit.    Socially, he completed 12th grade education. He is married for 33 years the he works for [employer]. 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 He exercises approximately 2 times per week doing he routinely does not do cardiac or aerobic workouts. He's never smoked tobacco. He denies alcohol use.  Family History  Problem Relation Age of Onset  . Heart attack Mother   . Hyperlipidemia Mother   . Hypertension Mother   .  Heart attack Father   . Hyperlipidemia Father   . Colon cancer Maternal Grandmother   . Heart attack Paternal Grandmother   . Heart disease Paternal Grandmother   . Heart attack Paternal Grandfather   . Heart disease Paternal Grandfather     ROS General: Negative; No fevers, chills, or night sweats;  HEENT: Negative; No changes in vision or hearing, sinus congestion, difficulty swallowing Pulmonary: Negative; No cough, wheezing, shortness of breath, hemoptysis Cardiovascular: Negative; No chest pain, presyncope, syncope, palpitations GI: Negative; No nausea, vomiting, diarrhea, or abdominal pain GU: Negative; No dysuria,  hematuria, or difficulty voiding Musculoskeletal: Negative; no myalgias, joint pain, or weakness Hematologic/Oncology: Negative; no easy bruising, bleeding Endocrine: Negative; no heat/cold intolerance; no diabetes Neuro: Negative; no changes in balance, headaches Skin: Negative; No rashes or skin lesions Psychiatric: Negative; No behavioral problems, depression Sleep: Positive for nonrestorative sleep, snoring, and daytime sleepiness;  no bruxism, restless legs, hypnogognic hallucinations, no cataplexy Other comprehensive 14 point system review is negative.   PE BP 138/98 mmHg  [name] 66  Ht 6\' 5"  (1.956 m)  Wt 221 lb 8 oz (100.472 kg)  BMI 26.26 kg/m2   Wt Readings from Last 3 Encounters:  05/16/15 221 lb 8 oz (100.472 kg)  03/16/14 225 lb (102.059 kg)  04/26/13 217 lb 1.6 oz (98.476 kg)   General: Alert, oriented, no distress.  HEENT: Normocephalic, atraumatic. Pupils round and reactive; sclera anicteric; Fundi normal Nose without nasal septal hypertrophy Mouth/Parynx benign; Mallinpatti scale 3 Neck: No JVD, no carotid bruits with normal carotid upstroke Lungs: clear to ausculatation and percussion; no wheezing or rales Chest wall with pectus excavatum deformity; nontender to palpation Heart: RRR, s1 s2 normal, intermittent click, whiff of a systolic murmur; no diastolic murmur, rubs, thrills or heaves. Abdomen: soft, nontender; no hepatosplenomehaly, BS+; abdominal aorta nontender and not dilated by palpation. Back: No CVA tenderness Pulses 2+ Extremities: no clubbinbg cyanosis or edema, Homan's sign negative  Neurologic: grossly nonfocal Psychological: Normal affect and mood; normal cognitive function  ECG (independently read by me):  Normal sinus rhythm at 68 bpm.  Normal intervals.  Prior ECG: Sinus bradycardia at 59 beats per minute. PR interval 178 ms, QTc interval 403 ms.  ASSESSMENT AND PLAN:  [name] is a 54 year old occasion male who has a strong  family  history for coronary artery disease with both parents having had heart attacks. His father suffered his initial heart attack at age 51 and subsequently had CABG surgery x3 and also has stents as well as a defibrillator.  When I initially saw him, I recommended he undergo a routine treadmill test which was negative for ischemia.  An echo Doppler study showed normal systolic function with mild diastolic relaxation abnormality and a mildly thickened mitral valve with systolic bowing without definitive prolapse.  His blood pressure today is 138/90 when rechecked by me.  He is not on any medication for blood pressure.  ECG remains unchanged.  He has had difficulty with recent nonrestorative sleep, increased snoring, and daytime sleepiness.  He will be referred for diagnostic polysomnogram to evaluate for potential obstructive sleep apnea.  His blood pressure is elevated mildly.  I have recommended reduction and sodium intake as well as increased exercise.  Laboratory will be obtained in the fasting state.  I will see him back in the office in follow-up in 3 months.  He will monitor his blood pressure. Further recommendations will be made at that time.  Time spent: 25 minutes  Maisie Fus  A. Tresa Endo, MD, [facility] 05/18/2015 4:35 PM

## 2015-06-13 LAB — CBC
HCT: 47.6 % (ref 39.0–52.0)
HEMOGLOBIN: 16.2 g/dL (ref 13.0–17.0)
MCH: 30.8 pg (ref 26.0–34.0)
MCHC: 34 g/dL (ref 30.0–36.0)
MCV: 90.5 fL (ref 78.0–100.0)
MPV: 9.9 fL (ref 8.6–12.4)
Platelets: 193 10*3/uL (ref 150–400)
RBC: 5.26 MIL/uL (ref 4.22–5.81)
RDW: 13.1 % (ref 11.5–15.5)
WBC: 8.6 10*3/uL (ref 4.0–10.5)

## 2015-06-13 LAB — COMPREHENSIVE METABOLIC PANEL
ALT: 38 U/L (ref 0–53)
AST: 27 U/L (ref 0–37)
Albumin: 4.3 g/dL (ref 3.5–5.2)
Alkaline Phosphatase: 48 U/L (ref 39–117)
BILIRUBIN TOTAL: 0.9 mg/dL (ref 0.2–1.2)
BUN: 14 mg/dL (ref 6–23)
CALCIUM: 8.9 mg/dL (ref 8.4–10.5)
CO2: 28 meq/L (ref 19–32)
Chloride: 101 mEq/L (ref 96–112)
Creat: 1.2 mg/dL (ref 0.50–1.35)
GLUCOSE: 124 mg/dL — AB (ref 70–99)
Potassium: 4.2 mEq/L (ref 3.5–5.3)
Sodium: 141 mEq/L (ref 135–145)
Total Protein: 6.9 g/dL (ref 6.0–8.3)

## 2015-06-13 LAB — LIPID PANEL
CHOL/HDL RATIO: 3.2 ratio
Cholesterol: 102 mg/dL (ref 0–200)
HDL: 32 mg/dL — AB (ref >=40)
LDL CALC: 49 mg/dL (ref 0–99)
TRIGLYCERIDES: 107 mg/dL (ref <150)
VLDL: 21 mg/dL (ref 0–40)

## 2015-06-13 LAB — TSH: TSH: 2.008 u[IU]/mL (ref 0.350–4.500)

## 2015-06-14 ENCOUNTER — Other Ambulatory Visit: Payer: Self-pay | Admitting: Cardiovascular Disease

## 2015-06-14 ENCOUNTER — Encounter: Payer: Self-pay | Admitting: *Deleted

## 2015-06-14 NOTE — Telephone Encounter (Signed)
Rx(s) sent to pharmacy electronically.  

## 2015-06-17 ENCOUNTER — Other Ambulatory Visit: Payer: Self-pay | Admitting: Cardiovascular Disease

## 2015-07-30 ENCOUNTER — Ambulatory Visit (HOSPITAL_BASED_OUTPATIENT_CLINIC_OR_DEPARTMENT_OTHER): Payer: BLUE CROSS/BLUE SHIELD | Attending: Cardiovascular Disease

## 2015-07-30 VITALS — Ht 77.0 in | Wt 225.0 lb

## 2015-07-30 DIAGNOSIS — G4719 Other hypersomnia: Secondary | ICD-10-CM | POA: Diagnosis not present

## 2015-07-30 DIAGNOSIS — R0683 Snoring: Secondary | ICD-10-CM

## 2015-07-30 DIAGNOSIS — Z8249 Family history of ischemic heart disease and other diseases of the circulatory system: Secondary | ICD-10-CM

## 2015-07-30 DIAGNOSIS — G4733 Obstructive sleep apnea (adult) (pediatric): Secondary | ICD-10-CM | POA: Insufficient documentation

## 2015-08-05 NOTE — Sleep Study (Signed)
NAME: Bruce [name] DATE OF BIRTH:  1961-09-05 MEDICAL RECORD NUMBER [id number]  LOCATION: [facility]  PHYSICIAN: Naven Giambalvo A  DATE OF STUDY: 07/30/2015  SLEEP STUDY TYPE: Nocturnal Polysomnogram               REFERRING PHYSICIAN: Lennette Bihari, MD   Gender: Male D.O.B: 01/14/1961 Age (years): 68 Referring Provider: Nicki Guadalajara MD, ABSM Height (inches): 77 Interpreting Physician: Nicki Guadalajara MD, ABSM Weight (lbs): 225 RPSGT: Wylie Hail BMI: 27 MRN: [id number] Neck Size: 16.50    Indication for sleep study: Excessive Daytime Sleepiness, loud snoring; nonrestorative sleep; witnessed apnea  Epworth Sleepiness Score: not available  SLEEP STUDY TECHNIQUE As per the AASM Manual for the Scoring of Sleep and Associated Events v2.3 (April 2016) with a hypopnea requiring 4% desaturations.  The channels recorded and monitored were frontal, central and occipital EEG, electrooculogram (EOG), submentalis EMG (chin), nasal and oral airflow, thoracic and abdominal wall motion, anterior tibialis EMG, snore microphone, electrocardiogram, and pulse oximetry.  MEDICATIONS   aspirin 81 MG tablet 81 mg, Daily     atorvastatin (LIPITOR) 20 MG tablet 20 mg, Daily     atorvastatin (LIPITOR) 20 MG tablet      Coenzyme Q10 (EQL COQ10) 300 MG CAPS 1 capsule, Daily     fish oil-omega-3 fatty acids 1000 MG capsule   Medications self-administered by patient during sleep study : No sleep medicine administered.  SLEEP ARCHITECTURE The study was initiated at 10:48:23 PM and ended at 5:02:34 AM.  Sleep onset time was 6.3 minutes and the sleep efficiency was 83.8%. The total sleep time was 313.4 minutes.  Stage REM latency was 60.5 minutes.  wake after sleep onset (WASO): 54.5 minutes  The patient spent 17.87% of the night in stage N1 sleep, 62.35% in stage N2 sleep, 0.00% in stage N3 and 19.78% in REM.  Alpha intrusion was absent.  Supine sleep was  7.82%.  RESPIRATORY PARAMETERS The overall apnea/hypopnea index (AHI) was 7.3 per hour. There were 17 total apneas, including 7 obstructive, 7 central and 3 mixed apneas. There were 21 hypopneas and 41 RERAs.  The AHI during Stage REM sleep was 3.9 per hour.  AHI while supine was 51.4 per hour.  The mean oxygen saturation was 92.22%. The minimum SpO2 during sleep was 86.00%.  Time spent with O2 saturations less than 88% was 4.3 minutes.  Loud snoring was noted during this study.  CARDIAC DATA The 2 lead EKG demonstrated sinus rhythm. The mean heart rate was 61.64 beats per minute. Other EKG findings include: None.  LEG MOVEMENT DATA The total PLMS were 0 with a resulting PLMS index of 0.00. Associated arousal with leg movement index was 0.0 .  IMPRESSIONS Mild obstructive sleep apnea/hypopnea syndrome with an overall AHI of 7.3 per hour.  There is a severe positional component with severe sleep apnea with supine posture (AHI 51.4 per hour). No significant central sleep apnea occurred during this study (CAI = 1.3/h). Mild oxygen desaturation to a nadir of 86%. Loud snoring No cardiac abnormalities were noted during this study. Clinically significant periodic limb movements did not occur during sleep. No significant associated arousals.   DIAGNOSIS Obstructive Sleep Apnea (327.23 [G47.33 ICD-10])  RECOMMENDATIONS   In this patient with significant family history for premature coronary artery disease in both parents, cardiovascular comorbidities and significant symptoms of daytime sleepiness, loud snoring, and nonrestorative sleep recommend CPAP therapy.  If patient does not wish to pursue CPAP therapy,  recommend follow-up office visit to discuss alternatives to CPAP therapy such as a customized oral appliance or ENT evaluation for severe snoring. Due to severe obstructive sleep apnea with supine posture, recommend positional therapy and avoidance of supine sleep if possible. Avoid  alcohol, sedatives and other CNS depressants that may worsen sleep apnea and disrupt normal sleep architecture. Sleep hygiene should be reviewed to assess factors that may improve sleep quality. Weight management and regular exercise should be initiated or continued if appropriate.  Lennette Bihari Diplomate, American Board of Sleep Medicine  ELECTRONICALLY SIGNED ON:  08/05/2015, 6:46 PM [facility] PH: [phone]   FX: [phone] ACCREDITED BY THE AMERICAN ACADEMY OF SLEEP MEDICINE

## 2015-08-10 ENCOUNTER — Telehealth: Payer: Self-pay | Admitting: *Deleted

## 2015-08-10 DIAGNOSIS — G4733 Obstructive sleep apnea (adult) (pediatric): Secondary | ICD-10-CM

## 2015-08-10 NOTE — Telephone Encounter (Signed)
-----   Message from Lennette Bihari, MD sent at 08/05/2015  7:08 PM EDT ----- Bruce [name], mild sleep apnea; severe with supine sleep.  Set up CPAP titration, if pt does not wish to pursue then ov to discuss alternatives

## 2015-08-10 NOTE — Telephone Encounter (Signed)
Called and notfied patient of sleep study results and recommendations. He would like to have the titration study done. Order placed into epic.

## 2015-08-10 NOTE — Progress Notes (Signed)
Called and notified patient of sleep study results and recommendations. Patient would like to have CPAP titration. Order placed.

## 2015-08-19 ENCOUNTER — Other Ambulatory Visit: Payer: Self-pay | Admitting: Cardiovascular Disease

## 2015-08-21 ENCOUNTER — Ambulatory Visit (HOSPITAL_BASED_OUTPATIENT_CLINIC_OR_DEPARTMENT_OTHER): Payer: BLUE CROSS/BLUE SHIELD | Attending: Cardiovascular Disease

## 2015-08-21 VITALS — Ht 77.0 in | Wt 225.0 lb

## 2015-08-21 DIAGNOSIS — G4730 Sleep apnea, unspecified: Secondary | ICD-10-CM | POA: Diagnosis present

## 2015-08-21 DIAGNOSIS — G4733 Obstructive sleep apnea (adult) (pediatric): Secondary | ICD-10-CM

## 2015-08-21 DIAGNOSIS — R0683 Snoring: Secondary | ICD-10-CM | POA: Insufficient documentation

## 2015-08-21 DIAGNOSIS — G4736 Sleep related hypoventilation in conditions classified elsewhere: Secondary | ICD-10-CM | POA: Insufficient documentation

## 2015-08-29 ENCOUNTER — Ambulatory Visit (INDEPENDENT_AMBULATORY_CARE_PROVIDER_SITE_OTHER): Payer: BLUE CROSS/BLUE SHIELD | Admitting: Cardiovascular Disease

## 2015-08-29 ENCOUNTER — Encounter: Payer: Self-pay | Admitting: Cardiovascular Disease

## 2015-08-29 VITALS — BP 150/100 | HR 66 | Ht 77.0 in | Wt 218.7 lb

## 2015-08-29 DIAGNOSIS — Z79899 Other long term (current) drug therapy: Secondary | ICD-10-CM

## 2015-08-29 DIAGNOSIS — E785 Hyperlipidemia, unspecified: Secondary | ICD-10-CM

## 2015-08-29 DIAGNOSIS — G4733 Obstructive sleep apnea (adult) (pediatric): Secondary | ICD-10-CM | POA: Diagnosis not present

## 2015-08-29 DIAGNOSIS — Z8249 Family history of ischemic heart disease and other diseases of the circulatory system: Secondary | ICD-10-CM

## 2015-08-29 MED ORDER — ATORVASTATIN CALCIUM 10 MG PO TABS: 10.0000 mg | ORAL_TABLET | Freq: Every day | ORAL | Status: DC

## 2015-08-29 MED ORDER — LISINOPRIL 5 MG PO TABS: 5.0000 mg | ORAL_TABLET | Freq: Every day | ORAL | Status: DC

## 2015-08-29 NOTE — Progress Notes (Signed)
Patient ID: Bruce [name], male   DOB: Mar 02, 1961, 54 y.o.   MRN: [id number]   Primary M.D.: Dr. Bronson Ing  HPI: Bruce [name] denies is 8 white male with a strong family history for CAD.  I had initially seen in 2 years ago after his mother had suffered an MI and recently saw him 3 months ago.  He presents for follow-up evaluation.  Bruce [name] denies any known CAD or episodes of chest pain. He states that over the years he has had slight progression of  cholesterol elevation but has consistently had low good cholesterol levels. He is followed by Dr. Serina Cowper.  Laboratory August 2013 showed a total cholesterol 178, triglycerides 196, HDL cholesterol 33, non-HDL cholesterol 145 and LDL cholesterol 113. He had never been on lipid lowering therapy and at that time did not take any medications with the exception of aspirin and over-the-counter fish oil.    In 2014, after his mother had suffered a heart attack, he underwent a routine treadmill test.  His was interpreted as negative, adequate Bruce treadmill test and he was able to exercise for 13.7 that workload and achieving AP, rate of 102% of age-predicted maximum heart rate.  He did not have diagnostic ST changes of ischemia.  He underwent an echo Doppler study on 05/17/2013 which revealed normal systolic function.  There was grade 1 diastolic dysfunction.  He had mild thickening of his mitral valve with systolic bowing without definitive prolapse.  Over the past several years, he has continued to be active.  He denies any exertional precipitation of chest pain.  He denies any change in exercise tolerance.  He was started on atorvastatin 20 mg and takes omega-3 fatty acids as well as coenzyme Q10 for hyperlipidemia.  When I saw him 3 months ago, he was complaining of significant deterioration of his sleep.  He reportedly is snoring loudly.  His sleep is nonrestorative.  He states that if given the opportunity every day at 3 PM he would be able to  sleep very well.  He typically goes to bed between 10 and 11 PM and wakes up between 6 and 6:30 AM.    I scheduled him for a sleep study which was done on 07/30/2015.  He was found to have mild obstructive sleep apnea with an AHI overall of 7.3 per hour.  However, he had a severe positional component with severe sleep apnea with supine position with an AHI of 51.4 per hour.  There was mild oxygen desaturation to a nadir of 86%.  There was loud snoring.  He underwent a CPAP titration trial last week.  He was titrated up to see Pap pressure of 15 7 m water pressure.  He has not yet been contacted by the DME company to initiate CPAP treatment.  He presents for follow-up evaluation  Past Medical History  Diagnosis Date  . Kidney stones     Past Surgical History  Procedure Laterality Date  . Ruptured disc  2002    Dr. Rolley Sims    No Known Allergies  Current Outpatient Prescriptions  Medication Sig Dispense Refill  . aspirin 81 MG tablet Take 81 mg by mouth daily.    . Coenzyme Q10 (EQL COQ10) 300 MG CAPS Take 1 capsule by mouth daily.    . fish oil-omega-3 fatty acids 1000 MG capsule Take by mouth daily. 1200 MG CAPS. Takes 4 daily.    [name] atorvastatin (LIPITOR) 10 MG tablet Take 1  tablet (10 mg total) by mouth daily. 30 tablet 6  . lisinopril (PRINIVIL,ZESTRIL) 5 MG tablet Take 1 tablet (5 mg total) by mouth daily. 90 tablet 3   No current facility-administered medications for this visit.    Socially, he completed 12th grade education. He is married for 33 years the he works for [employer]. He is the owner and in Press photographer. He exercises approximately 2 times per week doing he routinely does not do cardiac or aerobic workouts. He's never smoked tobacco. He denies alcohol use.  Family History  Problem Relation Age of Onset  . Heart attack Mother   . Hyperlipidemia Mother   . Hypertension Mother   . Heart attack Father   . Hyperlipidemia Father   . Colon  cancer Maternal Grandmother   . Heart attack Paternal Grandmother   . Heart disease Paternal Grandmother   . Heart attack Paternal Grandfather   . Heart disease Paternal Grandfather     ROS General: Negative; No fevers, chills, or night sweats;  HEENT: Negative; No changes in vision or hearing, sinus congestion, difficulty swallowing Pulmonary: Negative; No cough, wheezing, shortness of breath, hemoptysis Cardiovascular: Negative; No chest pain, presyncope, syncope, palpitations GI: Negative; No nausea, vomiting, diarrhea, or abdominal pain GU: Negative; No dysuria, hematuria, or difficulty voiding Musculoskeletal: Negative; no myalgias, joint pain, or weakness Hematologic/Oncology: Negative; no easy bruising, bleeding Endocrine: Negative; no heat/cold intolerance; no diabetes Neuro: Negative; no changes in balance, headaches Skin: Negative; No rashes or skin lesions Psychiatric: Negative; No behavioral problems, depression Sleep: Positive for nonrestorative sleep, snoring, and daytime sleepiness;  no bruxism, restless legs, hypnogognic hallucinations, no cataplexy Other comprehensive 14 point system review is negative.   PE BP 150/100 mmHg  Pulse 66  Ht _0  (1.956 m)  Wt 218 lb 11.2 oz (99.202 kg)  BMI 25.93 kg/m2  Repeat blood pressure by me was 140/92.  Wt Readings from Last 3 Encounters:  08/29/15 218 lb 11.2 oz (99.202 kg)  08/21/15 225 lb (102.059 kg)  07/30/15 225 lb (102.059 kg)   General: Alert, oriented, no distress.  HEENT: Normocephalic, atraumatic. Pupils round and reactive; sclera anicteric; Fundi normal Nose without nasal septal hypertrophy Mouth/Parynx benign; Mallinpatti scale 3 Neck: No JVD, no carotid bruits with normal carotid upstroke Lungs: clear to ausculatation and percussion; no wheezing or rales Chest wall with pectus excavatum deformity; nontender to palpation Heart: RRR, s1 s2 normal, intermittent click, whiff of a systolic murmur; no  diastolic murmur, rubs, thrills or heaves. Abdomen: soft, nontender; no hepatosplenomehaly, BS+; abdominal aorta nontender and not dilated by palpation. Back: No CVA tenderness Pulses 2+ Extremities: no clubbinbg cyanosis or edema, Homan's sign negative  Neurologic: grossly nonfocal Psychological: Normal affect and mood; normal cognitive function  ECG (independently read by me): Sinus rhythm at 66 bpm.  No ectopy.  Normal intervals.  June 2016 ECG (independently read by me):  Normal sinus rhythm at 68 bpm.  Normal intervals.  Prior ECG: Sinus bradycardia at 59 beats per minute. PR interval 178 ms, QTc interval 403 ms.  LABS: BMP Latest Ref Rng 06/12/2015 03/16/2014 05/25/2013  Glucose 70 - 99 mg/dL 124(H) 106(H) 102(H)  BUN 6 - 23 mg/dL _1 Creatinine 0.50 - 1.35 mg/dL 1.20 1.03 1.09  Sodium 135 - 145 mEq/L 141 140 139  Potassium 3.5 - 5.3 mEq/L 4.2 4.3 4.7  Chloride 96 - 112 mEq/L 101 101 101  CO2 19 - 32 mEq/L 28 30 29  Calcium 8.4 - 10.5 mg/dL 8.9 9.3 9.4   Hepatic Function Latest Ref Rng 06/12/2015 03/16/2014 05/25/2013  Total Protein 6.0 - 8.3 g/dL 6.9 7.0 7.1  Albumin 3.5 - 5.2 g/dL 4.3 4.5 4.4  AST 0 - 37 U/L _0 ALT 0 - 53 U/L 38 51 30  Alk Phosphatase 39 - 117 U/L 48 53 50  Total Bilirubin 0.2 - 1.2 mg/dL 0.9 0.8 0.5   CBC Latest Ref Rng 06/12/2015 05/25/2013 12/01/2010  WBC 4.0 - 10.5 K/uL 8.6 5.7 6.3  Hemoglobin 13.0 - 17.0 g/dL 16.2 16.4 15.5  Hematocrit 39.0 - 52.0 % 47.6 46.7 44.2  Platelets 150 - 400 K/uL 193 199 203   Lab Results  Component Value Date   MCV 90.5 06/12/2015   MCV 89.8 05/25/2013   MCV 91.9 12/01/2010   Lab Results  Component Value Date   TSH 2.008 06/12/2015  No results found for: HGBA1C   Lipid Panel     Component Value Date/Time   CHOL 102 06/12/2015 0912   CHOL 166 05/25/2013 0855   TRIG 107 06/12/2015 0912   TRIG 179* 05/25/2013 0855   HDL 32* 06/12/2015 0912   HDL 33* 05/25/2013 0855   CHOLHDL 3.2 06/12/2015 0912    VLDL 21 06/12/2015 0912   LDLCALC 49 06/12/2015 0912   LDLCALC 97 05/25/2013 0855     ASSESSMENT AND PLAN:  Bruce [name] is a 54 year old Caucasian male who has a strong  family history for coronary artery disease with both parents having had heart attacks. His father suffered his initial heart attack at age 46 and subsequently had CABG surgery x3 and also has stents as well as a defibrillator.  In 2014, he had a negative adequate graded exercise treadmill test which was done after his mother had suffered a heart attack.  An echo Doppler study showed normal systolic function with mild diastolic relaxation abnormality and a mildly thickened mitral valve with systolic bowing without definitive prolapse.  When I saw him 3 months ago, his blood pressure was mildly elevated.  His blood pressure today continues to be elevated.  I am recommending initiation of lisinopril 5 mg daily to his medical regimen.  I reviewed his most recent lipid studies.  He was wondering if he could have a reduced dose of the Lipitor.  Since his LDL cholesterol is 49.  I have suggested he try reducing his Lipitor to 10 mg.  I reviewed his diagnostic polysomnogram study with him in detail, as well as the data from his most recent CPAP titration trial.  He will be initiated on CPAP therapy in the very near future and is waiting to hear from the DME company.  I discussed with him the importance for compliance.  I will see him in a aproximately 3 months in a sleep clinic following initiation of CPAP therapy, and at that time we'll reassess his blood pressure and lipid studies.  Prior to that office visit he will undergo repeat chemistry and lipid panel.    Time spent: 25 minutes  Troy Sine, MD, [facility] 08/29/2015 12:30 PM

## 2015-08-29 NOTE — Patient Instructions (Signed)
Your physician has recommended you make the following change in your medication: the atorvastatin has been decreased to 10 mg daily. ( 1/2 tablet daily)  Start new prescription for lisinopril 5 mg. This has already been sent to your pharmacy.  Your physician recommends that you return for lab work in: 2 months.  Your physician recommends that you schedule a follow-up appointment in: 3 months.

## 2015-09-03 NOTE — Sleep Study (Signed)
NAME: Bruce [name] DATE OF BIRTH:  11-12-1961 MEDICAL RECORD NUMBER [id number]  LOCATION: [facility]   Patient Name: Bruce [name], Bruce [name] Date: 08/21/2015 Gender: Male D.O.B: [birth year] Age (years): 25 Referring Provider: Nicki Guadalajara MD, ABSM Height (inches): 77 Interpreting Physician: Nicki Guadalajara MD, ABSM Weight (lbs): 225 RPSGT: Wylie Hail BMI: 27 MRN: [id number] Neck Size: 16.50  CLINICAL INFORMATION The patient is referred for a CPAP titration to treat sleep apnea. he was found to have mild sleep apnea overall with an AHI of 7.3 per hour but had severe positional component with an AHI of 51.4 per hour with supine sleep, and loud snoring.  Date of NPSG, Split Night or HST: 07/30/2015  SLEEP STUDY TECHNIQUE As per the AASM Manual for the Scoring of Sleep and Associated Events v2.3 (April 2016) with a hypopnea requiring 4% desaturations.  The channels recorded and monitored were frontal, central and occipital EEG, electrooculogram (EOG), submentalis EMG (chin), nasal and oral airflow, thoracic and abdominal wall motion, anterior tibialis EMG, snore microphone, electrocardiogram, and pulse oximetry. Continuous positive airway pressure (CPAP) was initiated at the beginning of the study and titrated to treat sleep-disordered breathing.  The patient was titrated from 4 cm water pressure to 15 cm.  MEDICATIONS   aspirin 81 MG tablet 81 mg, Daily     atorvastatin (LIPITOR) 10 MG tablet 10 mg, Daily     Coenzyme Q10 (EQL COQ10) 300 MG CAPS 1 capsule, Daily     fish oil-omega-3 fatty acids 1000 MG capsule Daily     lisinopril (PRINIVIL,ZESTRIL) 5 MG tablet   Medications administered by patient during sleep study : No sleep medicine administered.  TECHNICIAN COMMENTS Comments added by technician: PATIENT TOLERATED CPAP WITHOUT DIFFICULTY  Comments added by scorer: N/A  RESPIRATORY PARAMETERS Optimal PAP Pressure (cm): 15 AHI at Optimal Pressure  (/hr): N/A Overall Minimal O2 (%): 89.00 Supine % at Optimal Pressure (%): N/A Minimal O2 at Optimal Pressure (%): 92.0      SLEEP ARCHITECTURE The study was initiated at 11:02:41 PM and ended at 5:04:07 AM.  Sleep onset time was 10.7 minutes and the sleep efficiency was 83.6%. The total sleep time was 302.0 minutes.  The patient spent 10.43% of the night in stage N1 sleep, 61.42% in stage N2 sleep, 0.17% in stage N3 and 27.98% in REM.Stage REM latency was 43.0 minutes  Wake after sleep onset was 48.8. Alpha intrusion was absent. Supine sleep was 21.19%.  CARDIAC DATA The 2 lead EKG demonstrated sinus rhythm. The mean heart rate was 67.34 beats per minute. Other EKG findings include: None.  LEG MOVEMENT DATA The total Periodic Limb Movements of Sleep (PLMS) were 0. The PLMS index was 0.00. A PLMS index of <15 is considered normal in adults.  IMPRESSIONS Obstructive sleep apnea, improved with CPAP at 15 cm Central sleep apnea was not noted during this titration (CAI = 0.4/h). Mild oxygen desaturation (89%) which improved with CPAP therapy and a nadir of 92% at 15 cm water pressure The patient snored with Moderate snoring volume during this titration study. No cardiac abnormalities were observed during this study. Clinically significant periodic limb movements were not noted during this study. Arousals associated with PLMs were rare.  DIAGNOSIS Obstructive Sleep Apnea (327.23 [G47.33 ICD-10]) Nocturnal Hypoxemia (327.26 [G47.36 ICD-10])  RECOMMENDATIONS Due to the patient's previous documentation of a marked positional component to his sleep apnea, recommend initial CPAP Auto with a minimum of 8 and maximum 20 cm water pressure  with heated humidification. Avoid alcohol, sedatives and other CNS depressants that may worsen sleep apnea and disrupt normal sleep architecture. Sleep hygiene should be reviewed to assess factors that may improve sleep quality. Weight management and regular  exercise should be initiated or continued. Recommend download in 30 days in sleep clinic evaluation   Lennette Bihari, MD, [facility] Diplomate, American Board of Sleep Medicine  ELECTRONICALLY SIGNED ON:  09/03/2015, 6:42 PM [facility] PH: [phone]   FX: [phone] ACCREDITED BY THE AMERICAN ACADEMY OF SLEEP MEDICINE

## 2015-09-06 ENCOUNTER — Telehealth: Payer: Self-pay | Admitting: *Deleted

## 2015-09-06 NOTE — Telephone Encounter (Signed)
Called patient to inform him a CPAP referral has been sent to [facility]. Once his insurance benefits have been verified they will contact him to set up for his therapy. Patient voice verbal understanding.

## 2015-09-06 NOTE — Telephone Encounter (Signed)
-----   Message from Lennette Bihari, MD sent at 09/03/2015  7:01 PM EDT ----- Burna Mortimer; please set up with DME to initiate CPAP Auto

## 2015-09-06 NOTE — Progress Notes (Signed)
Referral sent [facility] for [facility] auto.

## 2015-09-21 ENCOUNTER — Other Ambulatory Visit: Payer: Self-pay | Admitting: *Deleted

## 2015-09-21 MED ORDER — ATORVASTATIN CALCIUM 10 MG PO TABS: 10.0000 mg | ORAL_TABLET | Freq: Every day | ORAL | Status: DC

## 2015-10-15 ENCOUNTER — Telehealth: Payer: Self-pay | Admitting: Cardiovascular Disease

## 2015-10-15 NOTE — Telephone Encounter (Signed)
Pt called in stating that Lisinopril is giving him a dry cough and he would like to switch to a different medication. Please f/u with him  Thanks

## 2015-10-15 NOTE — Telephone Encounter (Signed)
Returned call to patient.He stated lisinopril causing non productive cough.[facility]

## 2015-10-16 MED ORDER — LOSARTAN POTASSIUM 50 MG PO TABS: 50.0000 mg | ORAL_TABLET | Freq: Every day | ORAL | Status: DC

## 2015-10-16 NOTE — Telephone Encounter (Signed)
Returned call to patient, notified of Dr. Landry DykeKelly's advice to start new med and discontinue lisinopril, pt voiced understanding.  Rx sent to pt's preferred pharmacy, he acknowledged.

## 2015-10-16 NOTE — Telephone Encounter (Signed)
[name] is calling to see if the medication change was ok by Dr. Tresa EndoKelly , please call .[name]. Thanks

## 2015-10-16 NOTE — Telephone Encounter (Signed)
Change to losartan 50mg daily.

## 2015-11-08 ENCOUNTER — Ambulatory Visit: Payer: BLUE CROSS/BLUE SHIELD | Admitting: Cardiovascular Disease

## 2015-11-11 ENCOUNTER — Ambulatory Visit (HOSPITAL_BASED_OUTPATIENT_CLINIC_OR_DEPARTMENT_OTHER): Payer: BLUE CROSS/BLUE SHIELD

## 2015-12-13 ENCOUNTER — Ambulatory Visit (INDEPENDENT_AMBULATORY_CARE_PROVIDER_SITE_OTHER): Payer: BLUE CROSS/BLUE SHIELD | Admitting: Cardiovascular Disease

## 2015-12-13 ENCOUNTER — Encounter: Payer: Self-pay | Admitting: Cardiovascular Disease

## 2015-12-13 VITALS — BP 124/84 | HR 63 | Ht 77.0 in | Wt 237.0 lb

## 2015-12-13 DIAGNOSIS — I10 Essential (primary) hypertension: Secondary | ICD-10-CM

## 2015-12-13 DIAGNOSIS — Z8249 Family history of ischemic heart disease and other diseases of the circulatory system: Secondary | ICD-10-CM | POA: Diagnosis not present

## 2015-12-13 DIAGNOSIS — G4733 Obstructive sleep apnea (adult) (pediatric): Secondary | ICD-10-CM | POA: Diagnosis not present

## 2015-12-13 DIAGNOSIS — E785 Hyperlipidemia, unspecified: Secondary | ICD-10-CM | POA: Diagnosis not present

## 2015-12-13 DIAGNOSIS — R0683 Snoring: Secondary | ICD-10-CM

## 2015-12-13 LAB — COMPREHENSIVE METABOLIC PANEL
ALBUMIN: 4.3 g/dL (ref 3.6–5.1)
ALK PHOS: 51 U/L (ref 40–115)
ALT: 95 U/L — AB (ref 9–46)
AST: 42 U/L — AB (ref 10–35)
BILIRUBIN TOTAL: 0.7 mg/dL (ref 0.2–1.2)
BUN: 17 mg/dL (ref 7–25)
CO2: 33 mmol/L — AB (ref 20–31)
CREATININE: 0.94 mg/dL (ref 0.70–1.33)
Calcium: 9 mg/dL (ref 8.6–10.3)
Chloride: 102 mmol/L (ref 98–110)
GLUCOSE: 116 mg/dL — AB (ref 65–99)
Potassium: 4.5 mmol/L (ref 3.5–5.3)
SODIUM: 139 mmol/L (ref 135–146)
Total Protein: 6.5 g/dL (ref 6.1–8.1)

## 2015-12-13 LAB — LIPID PANEL
CHOL/HDL RATIO: 3.6 ratio (ref <=5.0)
CHOLESTEROL: 116 mg/dL — AB (ref 125–200)
HDL: 32 mg/dL — ABNORMAL LOW (ref >=40)
LDL CALC: 59 mg/dL (ref <130)
TRIGLYCERIDES: 123 mg/dL (ref <150)
VLDL: 25 mg/dL (ref <30)

## 2015-12-13 NOTE — Patient Instructions (Signed)
Your physician recommends that you schedule a follow-up appointment in: as needed with Dr Kelly for sleep.  

## 2015-12-13 NOTE — Progress Notes (Signed)
Patient ID: Bruce [name], male   DOB: 03/06/1961, 55 y.o.   MRN: [id number]   Primary M.D.: Dr. Thomasene Mohair  HPI: [name] denies is 46 white male presents to the office for sleep clinic evaluation following initiation of CPAP therapy.    [name] a strong family history for coronary artery disease and denies any known CAD or episodes of chest pain. He states that over the years he has had slight progression of  cholesterol elevation but has consistently had low good cholesterol levels. He is followed by Dr. Judithann Sauger.  Laboratory August 2013 showed a total cholesterol 178, triglycerides 196, HDL cholesterol 33, non-HDL cholesterol 145 and LDL cholesterol 113. He had never been on lipid lowering therapy and at that time did not take any medications with the exception of aspirin and over-the-counter fish oil.    In 2014, after his mother had suffered a heart attack, he underwent a routine treadmill test.  His was interpreted as negative, adequate Bruce treadmill test and he was able to exercise for 13.7 that workload and achieving AP, rate of 102% of age-predicted maximum heart rate.  He did not have diagnostic ST changes of ischemia.  He underwent an echo Doppler study on 05/17/2013 which revealed normal systolic function.  There was grade 1 diastolic dysfunction.  He had mild thickening of his mitral valve with systolic bowing without definitive prolapse.  Over the past several years, he has continued to be active.  He denies any exertional precipitation of chest pain.  He denies any change in exercise tolerance.  He was started on atorvastatin 20 mg and takes omega-3 fatty acids as well as coenzyme Q10 for hyperlipidemia.  Due to complaints of significant deterioration of his sleep quality with loud snoring and nonrestorative sleep.  I referred him for sleep study  which was done on 07/30/2015.  He was found to have mild obstructive sleep apnea with an AHI overall of 7.3 per hour.   However, he had a severe positional component with severe sleep apnea with supine position with an AHI of 51.4 per hour.  There was mild oxygen desaturation to a nadir of 86%. There was loud snoring.  He underwent a CPAP titration trial on 08/21/2015 and ultimately his obstructive apnea was improved at a CPAP pressure of 15.  However, with his previous documentation of a marked positional component to his sleep apnea a CPAP auto unit was recommended.  CPAP was instituted on 10/04/2015.  He apparently owns his own equipment and has an S9.  AutoSet.  He's been using a ResMed air for F10, large full face mask.  He is meeting compliance standards with 93% of days of usage and 90% of days greater than 4 hours.  He has been averaging 6 hours and 46 minutes.  His 95th percentile pressure was 16.3 with a maximum pressure of 18.4.  He does not have any leak.  His AHI was 7.1.  Subjectively, he feels markedly improved.  He denies residual daytime sleepiness.  He is unaware of breakthrough snoring.  Epworth Sleepiness Scale score was calculated today and this endorsed at 5, arguing against residual daytime sleepiness.  Past Medical History  Diagnosis Date  . Kidney stones     Past Surgical History  Procedure Laterality Date  . Ruptured disc  2002    Dr. Jabier Gauss    No Known Allergies  Current Outpatient Prescriptions  Medication Sig Dispense Refill  . aspirin 81  MG tablet Take 81 mg by mouth daily.    [name] atorvastatin (LIPITOR) 10 MG tablet Take 1 tablet (10 mg total) by mouth daily. 90 tablet 2  . Coenzyme Q10 (EQL COQ10) 300 MG CAPS Take 1 capsule by mouth daily.    . fish oil-omega-3 fatty acids 1000 MG capsule Take by mouth daily. 1200 MG CAPS. Takes 4 daily.    [name] losartan (COZAAR) 50 MG tablet Take 1 tablet (50 mg total) by mouth daily. 90 tablet 3   No current facility-administered medications for this visit.    Socially, he completed 12th grade education. He is married for 33 years the he  works for [employer]. He is the owner and in Press photographer. He exercises approximately 2 times per week doing he routinely does not do cardiac or aerobic workouts. He's never smoked tobacco. He denies alcohol use.  Family History  Problem Relation Age of Onset  . Heart attack Mother   . Hyperlipidemia Mother   . Hypertension Mother   . Heart attack Father   . Hyperlipidemia Father   . Colon cancer Maternal Grandmother   . Heart attack Paternal Grandmother   . Heart disease Paternal Grandmother   . Heart attack Paternal Grandfather   . Heart disease Paternal Grandfather     ROS General: Negative; No fevers, chills, or night sweats;  HEENT: Negative; No changes in vision or hearing, sinus congestion, difficulty swallowing Pulmonary: Negative; No cough, wheezing, shortness of breath, hemoptysis Cardiovascular: Negative; No chest pain, presyncope, syncope, palpitations GI: Negative; No nausea, vomiting, diarrhea, or abdominal pain GU: Negative; No dysuria, hematuria, or difficulty voiding Musculoskeletal: Negative; no myalgias, joint pain, or weakness Hematologic/Oncology: Negative; no easy bruising, bleeding Endocrine: Negative; no heat/cold intolerance; no diabetes Neuro: Negative; no changes in balance, headaches Skin: Negative; No rashes or skin lesions Psychiatric: Negative; No behavioral problems, depression Sleep: Positive for obstructive sleep apnea, now on CPAP therapy with resolution of prior nonrestorative sleep, snoring, and daytime sleepiness;  no bruxism, restless legs, hypnogognic hallucinations, no cataplexy Other comprehensive 14 point system review is negative.   PE BP 124/84 mmHg  [name] 61  Ht '6\' 5"'$  (1.956 m)  Wt 237 lb (107.502 kg)  BMI 28.10 kg/m2  SpO2 98%  Repeat blood pressure by me was 140/92.  Wt Readings from Last 3 Encounters:  12/13/15 237 lb (107.502 kg)  08/29/15 218 lb 11.2 oz (99.202 kg)  08/21/15 225 lb (102.059 kg)    General: Alert, oriented, no distress.  HEENT: Normocephalic, atraumatic. Pupils round and reactive; sclera anicteric; Fundi normal Nose without nasal septal hypertrophy Mouth/Parynx benign; Mallinpatti scale 3 Neck: No JVD, no carotid bruits with normal carotid upstroke Lungs: clear to ausculatation and percussion; no wheezing or rales Chest wall with pectus excavatum deformity; nontender to palpation Heart: RRR, s1 s2 normal, intermittent click, whiff of a systolic murmur; no diastolic murmur, rubs, thrills or heaves. Abdomen: soft, nontender; no hepatosplenomehaly, BS+; abdominal aorta nontender and not dilated by palpation. Back: No CVA tenderness Pulses 2+ Extremities: no clubbinbg cyanosis or edema, Homan's sign negative  Neurologic: grossly nonfocal Psychological: Normal affect and mood; normal cognitive function  September 2016 ECG (independently read by me): Sinus rhythm at 66 bpm.  No ectopy.  Normal intervals.  June 2016 ECG (independently read by me):  Normal sinus rhythm at 68 bpm.  Normal intervals.  Prior ECG: Sinus bradycardia at 59 beats per minute. PR interval 178 ms, QTc interval 403 ms.  LABS:  BMP Latest Ref Rng 12/13/2015 06/12/2015 03/16/2014  Glucose 65 - 99 mg/dL 116(H) 124(H) 106(H)  BUN 7 - 25 mg/dL '17 14 15  '$ Creatinine 0.70 - 1.33 mg/dL 0.94 1.20 1.03  Sodium 135 - 146 mmol/L 139 141 140  Potassium 3.5 - 5.3 mmol/L 4.5 4.2 4.3  Chloride 98 - 110 mmol/L 102 101 101  CO2 20 - 31 mmol/L 33(H) 28 30  Calcium 8.6 - 10.3 mg/dL 9.0 8.9 9.3   Hepatic Function Latest Ref Rng 12/13/2015 06/12/2015 03/16/2014  Total Protein 6.1 - 8.1 g/dL 6.5 6.9 7.0  Albumin 3.6 - 5.1 g/dL 4.3 4.3 4.5  AST 10 - 35 U/L 42(H) 27 31  ALT 9 - 46 U/L 95(H) 38 51  Alk Phosphatase 40 - 115 U/L 51 48 53  Total Bilirubin 0.2 - 1.2 mg/dL 0.7 0.9 0.8   CBC Latest Ref Rng 06/12/2015 05/25/2013 12/01/2010  WBC 4.0 - 10.5 K/uL 8.6 5.7 6.3  Hemoglobin 13.0 - 17.0 g/dL 16.2 16.4 15.5  Hematocrit  39.0 - 52.0 % 47.6 46.7 44.2  Platelets 150 - 400 K/uL 193 199 203   Lab Results  Component Value Date   MCV 90.5 06/12/2015   MCV 89.8 05/25/2013   MCV 91.9 12/01/2010   Lab Results  Component Value Date   TSH 2.008 06/12/2015  No results found for: HGBA1C   Lipid Panel     Component Value Date/Time   CHOL 116* 12/13/2015 0909   CHOL 166 05/25/2013 0855   TRIG 123 12/13/2015 0909   TRIG 179* 05/25/2013 0855   HDL 32* 12/13/2015 0909   HDL 33* 05/25/2013 0855   CHOLHDL 3.6 12/13/2015 0909   VLDL 25 12/13/2015 0909   LDLCALC 59 12/13/2015 0909   LDLCALC 97 05/25/2013 0855     ASSESSMENT AND PLAN:  Mr. Dimitry is a 55 year old Caucasian male who has a strong  family history for coronary artery disease with both parents having had heart attacks. His father suffered his initial heart attack at age 65 and subsequently had CABG surgery x3 and also has stents as well as a defibrillator.  In 2014, he had a negative adequate graded exercise treadmill test which was done after his mother had suffered a heart attack.  An echo Doppler study showed normal systolic function with mild diastolic relaxation abnormality and a mildly thickened mitral valve with systolic bowing without definitive prolapse.  He was referred for sleep study which confirmed obstructive sleep apnea with significant positional component for which she's been now utilizing CPAP auto therapy.  He has required high pressures and on his most recent download his 95th percentile.  Average pressure was 16.3 with a maximum of 18.4.  He feels significantly improved since initiating therapy.  He has more energy.  He is sleeping mostly through the night, rarely waking up as he had had before.  He is unaware of breakthrough snoring.  His blood pressure today with CPAP therapy is well-controlled at 122/80 on repeat by me on his current dose of losartan 50 mg.  He is on lipid lowering therapy with atorvastatin and he feels well on this reduced  dose with the addition of coenzyme Q 10.  I answered all his questions.  He is meeting compliance standards.  I will see him on an as-needed basis from a sleep perspective.   Time spent: 25 minutes  Troy Sine, MD, [facility] 12/13/2015 7:12 PM

## 2016-01-03 ENCOUNTER — Telehealth: Payer: Self-pay | Admitting: *Deleted

## 2016-01-03 ENCOUNTER — Other Ambulatory Visit: Payer: Self-pay | Admitting: *Deleted

## 2016-01-03 DIAGNOSIS — E785 Hyperlipidemia, unspecified: Secondary | ICD-10-CM

## 2016-01-03 DIAGNOSIS — R7989 Other specified abnormal findings of blood chemistry: Secondary | ICD-10-CM

## 2016-01-03 DIAGNOSIS — R945 Abnormal results of liver function studies: Secondary | ICD-10-CM

## 2016-01-03 NOTE — Telephone Encounter (Signed)
-----   Message from Lennette Bihari, MD sent at 12/23/2015  1:06 PM EST ----- LFT's inc new; hold atorvastatin and re-check in several weeks.

## 2016-01-03 NOTE — Telephone Encounter (Signed)
Patient already notified of results and recommendations.

## 2016-01-29 LAB — HEPATIC FUNCTION PANEL
ALBUMIN: 4.6 g/dL (ref 3.6–5.1)
ALK PHOS: 57 U/L (ref 40–115)
ALT: 59 U/L — ABNORMAL HIGH (ref 9–46)
AST: 36 U/L — ABNORMAL HIGH (ref 10–35)
Bilirubin, Direct: 0.1 mg/dL (ref <=0.2)
Indirect Bilirubin: 0.6 mg/dL (ref 0.2–1.2)
TOTAL PROTEIN: 7.3 g/dL (ref 6.1–8.1)
Total Bilirubin: 0.7 mg/dL (ref 0.2–1.2)

## 2016-01-29 LAB — LIPID PANEL
Cholesterol: 195 mg/dL (ref 125–200)
HDL: 32 mg/dL — AB (ref >=40)
LDL Cholesterol: 120 mg/dL (ref <130)
Total CHOL/HDL Ratio: 6.1 Ratio — ABNORMAL HIGH (ref <=5.0)
Triglycerides: 217 mg/dL — ABNORMAL HIGH (ref <150)
VLDL: 43 mg/dL — ABNORMAL HIGH (ref <30)

## 2016-02-06 ENCOUNTER — Other Ambulatory Visit: Payer: Self-pay | Admitting: *Deleted

## 2016-02-06 ENCOUNTER — Telehealth: Payer: Self-pay | Admitting: *Deleted

## 2016-02-06 DIAGNOSIS — R945 Abnormal results of liver function studies: Secondary | ICD-10-CM

## 2016-02-06 DIAGNOSIS — E785 Hyperlipidemia, unspecified: Secondary | ICD-10-CM

## 2016-02-06 DIAGNOSIS — R7989 Other specified abnormal findings of blood chemistry: Secondary | ICD-10-CM

## 2016-02-06 NOTE — Progress Notes (Signed)
Patient notified of lab results and recommendations. Informed Dr Tresa EndoKelly the patient was advised to stop the atorvastatin last time. He recommends that he continue to stay off and repeat liver and lipid in 3 months.

## 2016-02-06 NOTE — Telephone Encounter (Signed)
-----   Message from Lennette Biharihomas A Kelly, MD sent at 02/04/2016  8:00 AM EST ----- lft's better but lipid not as good on reduced dose of atorva; needs improved diet; re-check in 3 mo; may need zetia if still increased

## 2016-04-30 ENCOUNTER — Encounter: Payer: Self-pay | Admitting: *Deleted

## 2016-04-30 DIAGNOSIS — R945 Abnormal results of liver function studies: Secondary | ICD-10-CM

## 2016-04-30 DIAGNOSIS — R7989 Other specified abnormal findings of blood chemistry: Secondary | ICD-10-CM

## 2016-04-30 DIAGNOSIS — E785 Hyperlipidemia, unspecified: Secondary | ICD-10-CM

## 2016-05-16 DIAGNOSIS — R799 Abnormal finding of blood chemistry, unspecified: Secondary | ICD-10-CM | POA: Diagnosis not present

## 2016-05-16 DIAGNOSIS — R7989 Other specified abnormal findings of blood chemistry: Secondary | ICD-10-CM | POA: Diagnosis not present

## 2016-05-16 DIAGNOSIS — E785 Hyperlipidemia, unspecified: Secondary | ICD-10-CM | POA: Diagnosis not present

## 2016-05-16 LAB — LIPID PANEL
Cholesterol: 169 mg/dL (ref 125–200)
HDL: 34 mg/dL — AB (ref >=40)
LDL CALC: 103 mg/dL (ref <130)
TRIGLYCERIDES: 161 mg/dL — AB (ref <150)
Total CHOL/HDL Ratio: 5 Ratio (ref <=5.0)
VLDL: 32 mg/dL — ABNORMAL HIGH (ref <30)

## 2016-05-16 LAB — HEPATIC FUNCTION PANEL
ALBUMIN: 4.4 g/dL (ref 3.6–5.1)
ALT: 40 U/L (ref 9–46)
AST: 27 U/L (ref 10–35)
Alkaline Phosphatase: 49 U/L (ref 40–115)
Bilirubin, Direct: 0.1 mg/dL (ref <=0.2)
Indirect Bilirubin: 0.7 mg/dL (ref 0.2–1.2)
TOTAL PROTEIN: 6.8 g/dL (ref 6.1–8.1)
Total Bilirubin: 0.8 mg/dL (ref 0.2–1.2)

## 2016-05-23 ENCOUNTER — Telehealth: Payer: Self-pay | Admitting: *Deleted

## 2016-05-23 NOTE — Telephone Encounter (Signed)
-----   Message from Lennette Biharihomas A Kelly, MD sent at 05/20/2016  3:22 PM EDT ----- Labs okay: Lipid panel is improved; continue fish oil and would increase atorvastatin to 20 mg.

## 2016-05-23 NOTE — Telephone Encounter (Deleted)
-----   Message from Thomas A Kelly, MD sent at 05/20/2016  3:22 PM EDT ----- Labs okay: Lipid panel is improved; continue fish oil and would increase atorvastatin to 20 mg. 

## 2016-05-23 NOTE — Telephone Encounter (Signed)
Called patient to give lab results and recommendations. Bruce [name] informs me that Bruce [name] was taken off of the atorvastatin due to elevated LFT'S. Dr Tresa Endokelly was notified. Bruce [name] gave me VO for the patient to stay off of the atorvastatin. No other medication was ordered as a replacement. Patient notified of verbal orders given. Expressed understanding.

## 2016-07-04 ENCOUNTER — Other Ambulatory Visit: Payer: Self-pay | Admitting: Cardiovascular Disease

## 2016-07-04 NOTE — Telephone Encounter (Signed)
Rx(s) sent to pharmacy electronically.  

## 2016-08-29 DIAGNOSIS — I781 Nevus, non-neoplastic: Secondary | ICD-10-CM | POA: Diagnosis not present

## 2016-08-29 DIAGNOSIS — X32XXXD Exposure to sunlight, subsequent encounter: Secondary | ICD-10-CM | POA: Diagnosis not present

## 2016-08-29 DIAGNOSIS — L570 Actinic keratosis: Secondary | ICD-10-CM | POA: Diagnosis not present

## 2016-08-29 DIAGNOSIS — D225 Melanocytic nevi of trunk: Secondary | ICD-10-CM | POA: Diagnosis not present

## 2016-09-18 DIAGNOSIS — Z0000 Encounter for general adult medical examination without abnormal findings: Secondary | ICD-10-CM | POA: Diagnosis not present

## 2016-09-18 DIAGNOSIS — Z13220 Encounter for screening for lipoid disorders: Secondary | ICD-10-CM | POA: Diagnosis not present

## 2016-09-18 DIAGNOSIS — Z125 Encounter for screening for malignant neoplasm of prostate: Secondary | ICD-10-CM | POA: Diagnosis not present

## 2016-10-02 ENCOUNTER — Other Ambulatory Visit: Payer: Self-pay | Admitting: Cardiovascular Disease

## 2016-10-02 NOTE — Telephone Encounter (Signed)
Rx(s) sent to pharmacy electronically.  

## 2016-10-08 ENCOUNTER — Encounter: Payer: Self-pay | Admitting: *Deleted

## 2016-10-14 ENCOUNTER — Ambulatory Visit (INDEPENDENT_AMBULATORY_CARE_PROVIDER_SITE_OTHER): Payer: BLUE CROSS/BLUE SHIELD | Admitting: Orthopaedic Surgery

## 2016-10-14 ENCOUNTER — Encounter: Payer: Self-pay | Admitting: Cardiovascular Disease

## 2016-10-14 ENCOUNTER — Ambulatory Visit (INDEPENDENT_AMBULATORY_CARE_PROVIDER_SITE_OTHER): Payer: BLUE CROSS/BLUE SHIELD | Admitting: Cardiovascular Disease

## 2016-10-14 ENCOUNTER — Encounter (INDEPENDENT_AMBULATORY_CARE_PROVIDER_SITE_OTHER): Payer: Self-pay

## 2016-10-14 ENCOUNTER — Ambulatory Visit (INDEPENDENT_AMBULATORY_CARE_PROVIDER_SITE_OTHER): Payer: Self-pay

## 2016-10-14 VITALS — BP 124/92 | HR 70 | Ht 77.0 in | Wt 224.6 lb

## 2016-10-14 DIAGNOSIS — K219 Gastro-esophageal reflux disease without esophagitis: Secondary | ICD-10-CM

## 2016-10-14 DIAGNOSIS — E785 Hyperlipidemia, unspecified: Secondary | ICD-10-CM | POA: Diagnosis not present

## 2016-10-14 DIAGNOSIS — I10 Essential (primary) hypertension: Secondary | ICD-10-CM

## 2016-10-14 DIAGNOSIS — R7309 Other abnormal glucose: Secondary | ICD-10-CM | POA: Diagnosis not present

## 2016-10-14 DIAGNOSIS — M25552 Pain in left hip: Secondary | ICD-10-CM | POA: Diagnosis not present

## 2016-10-14 DIAGNOSIS — G4733 Obstructive sleep apnea (adult) (pediatric): Secondary | ICD-10-CM

## 2016-10-14 DIAGNOSIS — Z79899 Other long term (current) drug therapy: Secondary | ICD-10-CM | POA: Diagnosis not present

## 2016-10-14 DIAGNOSIS — M7062 Trochanteric bursitis, left hip: Secondary | ICD-10-CM

## 2016-10-14 MED ORDER — EZETIMIBE 10 MG PO TABS: 10.0000 mg | ORAL_TABLET | Freq: Every day | ORAL | 3 refills | Status: DC

## 2016-10-14 MED ORDER — METHYLPREDNISOLONE 4 MG PO TABS: ORAL_TABLET | ORAL | 0 refills | Status: DC

## 2016-10-14 NOTE — Patient Instructions (Signed)
Your physician recommends that you return for lab work in: 6 months . A letter and lab slips will be sent to you.  Your physician has recommended you make the following change in your medication:   1.) start new prescription for generic zetia.  Your physician wants you to follow-up in: 1 year or sooner if needed. You will receive a reminder letter in the mail two months in advance. If you don't receive a letter, please call our office to schedule the follow-up appointment.  If you need a refill on your cardiac medications before your next appointment, please call your pharmacy.

## 2016-10-14 NOTE — Progress Notes (Signed)
Patient ID: Bruce [name], male   DOB: 1961/03/27, 55 y.o.   MRN: [id number]   Primary M.D.: Dr. Thomasene Mohair  HPI: [name] denies is 47 white male presents to the office for 10 month follow-up cardiology evaluation.  [name] a strong family history for coronary artery disease and denies any known CAD or episodes of chest pain. He states that over the years he has had slight progression of  cholesterol elevation but has consistently had low good cholesterol levels. He is followed by Dr. Judithann Sauger.  Laboratory August 2013 showed a total cholesterol 178, triglycerides 196, HDL cholesterol 33, non-HDL cholesterol 145 and LDL cholesterol 113. He had never been on lipid lowering therapy and at that time did not take any medications with the exception of aspirin and over-the-counter fish oil.    In 2014, after his mother had suffered a heart attack, he underwent a routine treadmill test.  His was interpreted as negative, adequate Bruce treadmill test and he was able to exercise for 13.7 that workload and achieving AP, rate of 102% of age-predicted maximum heart rate.  He did not have diagnostic ST changes of ischemia.  He underwent an echo Doppler study on 05/17/2013 which revealed normal systolic function.  There was grade 1 diastolic dysfunction.  He had mild thickening of his mitral valve with systolic bowing without definitive prolapse.  Over the past several years, he has continued to be active.  He denies any exertional precipitation of chest pain.  He denies any change in exercise tolerance.  He was started on atorvastatin 20 mg and takes omega-3 fatty acids as well as coenzyme Q10 for hyperlipidemia.  Due to complaints of significant deterioration of his sleep quality with loud snoring and nonrestorative sleep.  I referred him for sleep study  which was done on 07/30/2015.  He was found to have mild obstructive sleep apnea with an AHI overall of 7.3 per hour.  However, he had a severe  positional component with severe sleep apnea with supine position with an AHI of 51.4 per hour.  There was mild oxygen desaturation to a nadir of 86%. There was loud snoring.  He underwent a CPAP titration trial on 08/21/2015 and ultimately his obstructive apnea was improved at a CPAP pressure of 15.  However, with his previous documentation of a marked positional component to his sleep apnea a CPAP auto unit was recommended.  CPAP was instituted on 10/04/2015.  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  When I saw him in January 2017.  He was meeting Medicare compliance standards.  He admits to 100% use.  He notes significantly more energy.  He denies any residual daytime sleepiness.  He is unaware of breakthrough snoring.  He denies any nocturnal palpitations.  He has been taking losartan 50 mg daily for hypertension.  He continues to take atorvastatin 10 mg for hyperlipidemia with omega-3 fatty acid and was recently started on pantoprazole 40 mg for reflux.  He states his GERD symptoms significantly improved with institution of CPAP therapy.  He presents for evaluation.   Past Medical History:  Diagnosis Date  . Kidney stones     Past Surgical History:  Procedure Laterality Date  . ruptured disc  2002   Dr. Jabier Gauss    No Known Allergies  Current Outpatient Prescriptions  Medication Sig Dispense Refill  . aspirin 81 MG tablet Take 81 mg by mouth daily.    [name]  Coenzyme Q10 (EQL COQ10) 300 MG CAPS Take 1 capsule by mouth daily.    . fish oil-omega-3 fatty acids 1000 MG capsule Take by mouth daily. 1200 MG CAPS. Takes 4 daily.    [name] losartan (COZAAR) 50 MG tablet TAKE 1 TABLET (50 MG TOTAL) BY MOUTH DAILY. 90 tablet 3  . pantoprazole (PROTONIX) 40 MG tablet Take 40 mg by mouth daily.  1  . ezetimibe (ZETIA) 10 MG tablet Take 1 tablet (10 mg total) by mouth daily. 90 tablet 3  . methylPREDNISolone (MEDROL) 4 MG tablet Medrol dose pack. Take as instructed 21 tablet 0   No current  facility-administered medications for this visit.     Socially, he completed 12th grade education. He is married for 34 years the he works for [employer]. He is the owner and in Press photographer. He exercises approximately 2 times per week doing he routinely does not do cardiac or aerobic workouts. He's never smoked tobacco. He denies alcohol use.  Family History  Problem Relation Age of Onset  . Heart attack Mother   . Hyperlipidemia Mother   . Hypertension Mother   . Heart attack Father   . Hyperlipidemia Father   . Colon cancer Maternal Grandmother   . Heart attack Paternal Grandmother   . Heart disease Paternal Grandmother   . Heart attack Paternal Grandfather   . Heart disease Paternal Grandfather     ROS General: Negative; No fevers, chills, or night sweats;  HEENT: Negative; No changes in vision or hearing, sinus congestion, difficulty swallowing Pulmonary: Negative; No cough, wheezing, shortness of breath, hemoptysis Cardiovascular: Negative; No chest pain, presyncope, syncope, palpitations GI: Negative; No nausea, vomiting, diarrhea, or abdominal pain GU: Negative; No dysuria, hematuria, or difficulty voiding Musculoskeletal: Negative; no myalgias, joint pain, or weakness Hematologic/Oncology: Negative; no easy bruising, bleeding Endocrine: Negative; no heat/cold intolerance; no diabetes Neuro: Negative; no changes in balance, headaches Skin: Negative; No rashes or skin lesions Psychiatric: Negative; No behavioral problems, depression Sleep: Positive for obstructive sleep apnea, now on CPAP therapy with resolution of prior nonrestorative sleep, snoring, and daytime sleepiness;  no bruxism, restless legs, hypnogognic hallucinations, no cataplexy Other comprehensive 14 point system review is negative.   PE BP (!) 124/92   [name] 68   Ht '6\' 5"'$  (1.956 m)   Wt 224 lb 9.6 oz (101.9 kg)   BMI 26.63 kg/m   Repeat blood pressure by me was 122/84.  Wt  Readings from Last 3 Encounters:  10/14/16 224 lb 9.6 oz (101.9 kg)  12/13/15 237 lb (107.5 kg)  08/29/15 218 lb 11.2 oz (99.2 kg)   General: Alert, oriented, no distress.  HEENT: Normocephalic, atraumatic. Pupils round and reactive; sclera anicteric; Fundi normal Nose without nasal septal hypertrophy Mouth/Parynx benign; Mallinpatti scale 3 Neck: No JVD, no carotid bruits with normal carotid upstroke Lungs: clear to ausculatation and percussion; no wheezing or rales Chest wall with pectus excavatum deformity; nontender to palpation Heart: RRR, s1 s2 normal, intermittent click, whiff of a systolic murmur; no diastolic murmur, rubs, thrills or heaves. Abdomen: soft, nontender; no hepatosplenomehaly, BS+; abdominal aorta nontender and not dilated by palpation. Back: No CVA tenderness Pulses 2+ Extremities: no clubbinbg cyanosis or edema, Homan's sign negative  Neurologic: grossly nonfocal Psychological: Normal affect and mood; normal cognitive function  ECG (independently read by me): Normal sinus rhythm at 70 bpm.  September 2016 ECG (independently read by me): Sinus rhythm at 66 bpm.  No ectopy.  Normal intervals.  June 2016 ECG (independently read by me):  Normal sinus rhythm at 68 bpm.  Normal intervals.  Prior ECG: Sinus bradycardia at 59 beats per minute. PR interval 178 ms, QTc interval 403 ms.  LABS:  Recent blood work from [facility] on 09/18/2016 was reviewed.   Hemoglobin 16.8, hematocrit 47.6.  Fasting glucose 120.  BUN 16, Cr1.09.  LFTs normal with an  an AST of 26 and ALT of 31.  Lipid studies revealed total cholesterol 180, triglycerides 194, HDL 32, LDL 109.   BMP Latest Ref Rng & Units 12/13/2015 06/12/2015 03/16/2014  Glucose 65 - 99 mg/dL 116(H) 124(H) 106(H)  BUN 7 - 25 mg/dL '17 14 15  '$ Creatinine 0.70 - 1.33 mg/dL 0.94 1.20 1.03  Sodium 135 - 146 mmol/L 139 141 140  Potassium 3.5 - 5.3 mmol/L 4.5 4.2 4.3  Chloride 98 - 110 mmol/L 102 101 101  CO2 20  - 31 mmol/L 33(H) 28 30  Calcium 8.6 - 10.3 mg/dL 9.0 8.9 9.3   Hepatic Function Latest Ref Rng & Units 05/16/2016 01/29/2016 12/13/2015  Total Protein 6.1 - 8.1 g/dL 6.8 7.3 6.5  Albumin 3.6 - 5.1 g/dL 4.4 4.6 4.3  AST 10 - 35 U/L 27 36(H) 42(H)  ALT 9 - 46 U/L 40 59(H) 95(H)  Alk Phosphatase 40 - 115 U/L 49 57 51  Total Bilirubin 0.2 - 1.2 mg/dL 0.8 0.7 0.7  Bilirubin, Direct <=0.2 mg/dL 0.1 0.1 -   CBC Latest Ref Rng & Units 06/12/2015 05/25/2013 12/01/2010  WBC 4.0 - 10.5 K/uL 8.6 5.7 6.3  Hemoglobin 13.0 - 17.0 g/dL 16.2 16.4 15.5  Hematocrit 39.0 - 52.0 % 47.6 46.7 44.2  Platelets 150 - 400 K/uL 193 199 203   Lab Results  Component Value Date   MCV 90.5 06/12/2015   MCV 89.8 05/25/2013   MCV 91.9 12/01/2010   Lab Results  Component Value Date   TSH 2.008 06/12/2015  No results found for: HGBA1C   Lipid Panel     Component Value Date/Time   CHOL 169 05/16/2016 0854   CHOL 166 05/25/2013 0855   TRIG 161 (H) 05/16/2016 0854   TRIG 179 (H) 05/25/2013 0855   HDL 34 (L) 05/16/2016 0854   HDL 33 (L) 05/25/2013 0855   CHOLHDL 5.0 05/16/2016 0854   VLDL 32 (H) 05/16/2016 0854   LDLCALC 103 05/16/2016 0854   LDLCALC 97 05/25/2013 0855     ASSESSMENT AND PLAN:  [name] is a 55 year old Caucasian male who has a strong  family history for coronary artery disease with both parents having had heart attacks. His father suffered his initial heart attack at age 36 and subsequently had CABG surgery x3 and also has stents as well as a defibrillator.  In 2014, he had a negative adequate graded exercise treadmill test which was done after his mother had suffered a heart attack.  An echo Doppler study showed normal systolic function with mild diastolic relaxation abnormality and a mildly thickened mitral valve with systolic bowing without definitive prolapse.  He has obstructive sleep apnea and has been using CPAP therapy.  He admits to 100% compliance.  With CPAP therapy, he has noted  significant improvement in prior GERD symptoms.  In addition, he has significant only more energy.  He denies any nocturnal awareness of palpitations.  His blood pressure today is stable on his current dose of losartan 50 mg daily.  He had been on atorvastatin 10 mg but due to LFT  elevation.  This has been discontinued.  His LFTs have ultimately normalized.  He underwent repeat blood work on 09/22/2016.  Lipid studies revealed a total cholesterol 180, triglycerides 194, LDL 109, HDL 32.  Since he no longer is on statin therapy, I am starting Zetia 10 mg, which should induce a 20% reduction in his lipids and should not adversely affect LFTs.  Repeat blood work will be obtained in 6 months and I will see him for follow-up evaluation in one year.  Time spent: 25 minutes  Troy Sine, MD, [facility] 10/14/2016 1:25 PM

## 2016-10-14 NOTE — Progress Notes (Signed)
Office Visit Note   Patient: Bruce PulseScott A Stage           Date of Birth: 07-Nov-1961           MRN: [id number] Visit Date: 10/14/2016              Requested by: Miguel AschoffAllan Ross, MD No address on file PCP: Duane LopeAlan Ross, MD   Assessment & Plan: Visit Diagnoses:  1. Pain in left hip   2. Trochanteric bursitis of left hip     Plan: He is going to try a six-day steroid taper followed by Advil twice daily for 2-3 weeks. I'm showed him stretching exercises to try for trochanteric bursitis as well as trying an over-the-counter topical anti-inflammatory. He'll follow up as needed for my next step would be trying a steroid injection of the trochanteric area of his left hip.  Follow-Up Instructions: Return if symptoms worsen or fail to improve.   Orders:  Orders Placed This Encounter  Procedures  . XR HIP UNILAT W OR W/O PELVIS 2-3 VIEWS LEFT   Meds ordered this encounter  Medications  . methylPREDNISolone (MEDROL) 4 MG tablet    Sig: Medrol dose pack. Take as instructed    Dispense:  21 tablet    Refill:  0      Procedures: No procedures performed   Clinical Data: No additional findings.   Subjective: Chief Complaint  Patient presents with  . Left Hip - Pain    Left hip pain x 6 months. No injury. Slight limp and discomfort. Most pain after sitting for long period of time the standing to walk. Not taking any medication for pain, patient states pain is not bad enough.   His friends of no disease been walking occasionally with a limp. He says his left hip is stiff and it does hurt and has stiffness when he tries across his left leg over his right. HPI  Review of Systems Negative for headache, shortness of breath, chest pain, fever, chills, nausea, vomiting.  Objective: Vital Signs: There were no vitals taken for this visit.  Physical Exam He is alert and oriented 3 in no acute distress her obvious discomfort Ortho Exam Examination of his right hip is normal exam. His left hip  has pain to palpation over the trochanteric area of the hip as well as the IT band. He has fluid and full range of motion but it hurts on extremes of internal and external rotation; groin but mainly over the mainly of the trochanteric area Specialty Comments:  No specialty comments available.  Imaging: Xr Hip Unilat W Or W/o Pelvis 2-3 Views Left  Result Date: 10/14/2016 An AP pelvis and a lateral of his left hip show a well maintained hip joint. There is good space between the superior femoral head and the acetabulum with no significant sclerotic or cystic changes. There is no significant osteophytes either. There is no cortical irregularities around the trochanteric area and there is no evidence of fracture.    PMFS History: Patient Active Problem List   Diagnosis Date Noted  . Essential hypertension 12/13/2015  . OSA (obstructive sleep apnea) 08/29/2015  . Hyperlipidemia with target LDL less than 100 04/26/2013  . Family history of premature CAD 04/26/2013  . Snoring 04/26/2013   Past Medical History:  Diagnosis Date  . Kidney stones     Family History  Problem Relation Age of Onset  . Heart attack Mother   . Hyperlipidemia Mother   . Hypertension Mother   .  Heart attack Father   . Hyperlipidemia Father   . Colon cancer Maternal Grandmother   . Heart attack Paternal Grandmother   . Heart disease Paternal Grandmother   . Heart attack Paternal Grandfather   . Heart disease Paternal Grandfather     Past Surgical History:  Procedure Laterality Date  . ruptured disc  2002   Dr. Jabier GaussGREG YATES   Social History   Occupational History  . Not on file.   Social History Main Topics  . Smoking status: Never Smoker  . Smokeless tobacco: Never Used  . Alcohol use Not on file  . Drug use: Unknown  . Sexual activity: Not on file

## 2017-02-24 ENCOUNTER — Telehealth (INDEPENDENT_AMBULATORY_CARE_PROVIDER_SITE_OTHER): Payer: Self-pay | Admitting: Orthopaedic Surgery

## 2017-02-24 NOTE — Telephone Encounter (Signed)
Felecia from Dr. Charlott Rakesoss's office called requesting the notes from the patient's 10/14/16 visit faxed to them.  FAX#[phone].  Thank you.

## 2017-02-26 NOTE — Telephone Encounter (Signed)
faxed

## 2017-04-03 ENCOUNTER — Other Ambulatory Visit: Payer: Self-pay | Admitting: *Deleted

## 2017-04-03 ENCOUNTER — Encounter: Payer: Self-pay | Admitting: *Deleted

## 2017-04-03 DIAGNOSIS — E785 Hyperlipidemia, unspecified: Secondary | ICD-10-CM

## 2017-04-03 DIAGNOSIS — R7309 Other abnormal glucose: Secondary | ICD-10-CM

## 2017-04-03 DIAGNOSIS — Z79899 Other long term (current) drug therapy: Secondary | ICD-10-CM

## 2017-04-03 DIAGNOSIS — I10 Essential (primary) hypertension: Secondary | ICD-10-CM

## 2017-04-28 DIAGNOSIS — I10 Essential (primary) hypertension: Secondary | ICD-10-CM | POA: Diagnosis not present

## 2017-04-28 DIAGNOSIS — Z79899 Other long term (current) drug therapy: Secondary | ICD-10-CM | POA: Diagnosis not present

## 2017-04-28 LAB — LIPID PANEL
CHOL/HDL RATIO: 4.3 ratio (ref <5.0)
Cholesterol: 141 mg/dL (ref <200)
HDL: 33 mg/dL — ABNORMAL LOW (ref >40)
LDL Cholesterol: 75 mg/dL (ref <100)
Triglycerides: 163 mg/dL — ABNORMAL HIGH (ref <150)
VLDL: 33 mg/dL — AB (ref <30)

## 2017-04-28 LAB — COMPREHENSIVE METABOLIC PANEL
ALT: 21 U/L (ref 9–46)
AST: 18 U/L (ref 10–35)
Albumin: 4.3 g/dL (ref 3.6–5.1)
Alkaline Phosphatase: 49 U/L (ref 40–115)
BUN: 18 mg/dL (ref 7–25)
CHLORIDE: 103 mmol/L (ref 98–110)
CO2: 30 mmol/L (ref 20–31)
CREATININE: 1.02 mg/dL (ref 0.70–1.33)
Calcium: 9.1 mg/dL (ref 8.6–10.3)
GLUCOSE: 116 mg/dL — AB (ref 65–99)
POTASSIUM: 4.7 mmol/L (ref 3.5–5.3)
SODIUM: 141 mmol/L (ref 135–146)
Total Bilirubin: 0.7 mg/dL (ref 0.2–1.2)
Total Protein: 6.7 g/dL (ref 6.1–8.1)

## 2017-04-28 LAB — CBC
HCT: 45.6 % (ref 38.5–50.0)
Hemoglobin: 15.4 g/dL (ref 13.2–17.1)
MCH: 31.1 pg (ref 27.0–33.0)
MCHC: 33.8 g/dL (ref 32.0–36.0)
MCV: 92.1 fL (ref 80.0–100.0)
MPV: 10 fL (ref 7.5–12.5)
PLATELETS: 206 10*3/uL (ref 140–400)
RBC: 4.95 MIL/uL (ref 4.20–5.80)
RDW: 13 % (ref 11.0–15.0)
WBC: 5.6 10*3/uL (ref 3.8–10.8)

## 2017-04-28 LAB — TSH: TSH: 2.5 m[IU]/L (ref 0.40–4.50)

## 2017-04-29 LAB — HEMOGLOBIN A1C
HEMOGLOBIN A1C: 5.6 % (ref <5.7)
MEAN PLASMA GLUCOSE: 114 mg/dL

## 2017-05-08 ENCOUNTER — Telehealth: Payer: Self-pay | Admitting: *Deleted

## 2017-05-08 NOTE — Telephone Encounter (Signed)
-----   Message from Lennette Biharihomas A Kelly, MD sent at 05/08/2017  9:43 AM EDT ----- Labs good x TG elevated abd HDL low; decrease sugar and carbs; inc exercise

## 2017-05-08 NOTE — Telephone Encounter (Signed)
Patient notified of lab results and recomendations. Patient voiced verbal understanding.

## 2017-06-12 DIAGNOSIS — H5201 Hypermetropia, right eye: Secondary | ICD-10-CM | POA: Diagnosis not present

## 2017-07-13 DIAGNOSIS — G4733 Obstructive sleep apnea (adult) (pediatric): Secondary | ICD-10-CM | POA: Diagnosis not present

## 2017-10-01 ENCOUNTER — Other Ambulatory Visit: Payer: Self-pay | Admitting: Cardiovascular Disease

## 2017-10-15 DIAGNOSIS — Z136 Encounter for screening for cardiovascular disorders: Secondary | ICD-10-CM | POA: Diagnosis not present

## 2017-10-15 DIAGNOSIS — Z0000 Encounter for general adult medical examination without abnormal findings: Secondary | ICD-10-CM | POA: Diagnosis not present

## 2017-10-15 DIAGNOSIS — Z125 Encounter for screening for malignant neoplasm of prostate: Secondary | ICD-10-CM | POA: Diagnosis not present

## 2017-10-21 ENCOUNTER — Ambulatory Visit (INDEPENDENT_AMBULATORY_CARE_PROVIDER_SITE_OTHER): Payer: BLUE CROSS/BLUE SHIELD | Admitting: Cardiovascular Disease

## 2017-10-21 ENCOUNTER — Encounter: Payer: Self-pay | Admitting: Cardiovascular Disease

## 2017-10-21 VITALS — BP 130/92 | HR 62 | Ht 77.0 in | Wt 228.6 lb

## 2017-10-21 DIAGNOSIS — K219 Gastro-esophageal reflux disease without esophagitis: Secondary | ICD-10-CM | POA: Diagnosis not present

## 2017-10-21 DIAGNOSIS — G4733 Obstructive sleep apnea (adult) (pediatric): Secondary | ICD-10-CM

## 2017-10-21 DIAGNOSIS — I10 Essential (primary) hypertension: Secondary | ICD-10-CM

## 2017-10-21 DIAGNOSIS — E785 Hyperlipidemia, unspecified: Secondary | ICD-10-CM

## 2017-10-21 DIAGNOSIS — Z8249 Family history of ischemic heart disease and other diseases of the circulatory system: Secondary | ICD-10-CM | POA: Diagnosis not present

## 2017-10-21 NOTE — Progress Notes (Signed)
Patient ID: Bruce [name], male   DOB: [birth year], 56 y.o.   MRN: [id number]   Primary M.D.: Dr. Bronson Ing  HPI: [name] denies is 30 white male presents to the office for 12 month follow-up cardiology evaluation.  [name] a strong family history for coronary artery disease and denies any known CAD or episodes of chest pain. He states that over the years he has had slight progression of  cholesterol elevation but has consistently had low good cholesterol levels. He is followed by Dr. Serina Cowper.  Laboratory August 2013 showed a total cholesterol 178, triglycerides 196, HDL cholesterol 33, non-HDL cholesterol 145 and LDL cholesterol 113. He had never been on lipid lowering therapy and at that time did not take any medications with the exception of aspirin and over-the-counter fish oil.    In 2014, after his mother had suffered a heart attack, he underwent a routine treadmill test.  His was interpreted as negative, adequate Bruce treadmill test and he was able to exercise for 13.7 that workload and achieving AP, rate of 102% of age-predicted maximum heart rate.  He did not have diagnostic ST changes of ischemia.  He underwent an echo Doppler study on 05/17/2013 which revealed normal systolic function.  There was grade 1 diastolic dysfunction.  He had mild thickening of his mitral valve with systolic bowing without definitive prolapse.  Over the past several years, he has continued to be active.  He denies any exertional precipitation of chest pain.  He denies any change in exercise tolerance.  He was started on atorvastatin 20 mg and takes omega-3 fatty acids as well as coenzyme Q10 for hyperlipidemia.  Due to complaints of significant deterioration of his sleep quality with loud snoring and nonrestorative sleep.  I referred him for sleep study  which was done on 07/30/2015.  He was found to have mild obstructive sleep apnea with an AHI overall of 7.3 per hour.  However, he had a severe  positional component with severe sleep apnea with supine position with an AHI of 51.4 per hour.  There was mild oxygen desaturation to a nadir of 86%. There was loud snoring.  He underwent a CPAP titration trial on 08/21/2015 and ultimately his obstructive apnea was improved at a CPAP pressure of 15.  However, with his previous documentation of a marked positional component to his sleep apnea a CPAP auto unit was recommended.  CPAP was instituted on 10/04/2015.  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  When I saw him in January 2017 he was meeting Medicare compliance standards with 100% use.  He notes significantly more energy.  He denies any residual daytime sleepiness.  He is unaware of breakthrough snoring.  He denies any nocturnal palpitations.  Since I last saw him one year ago, Bruce [name] has remained stable.  He denies any episodes of chest pain or palpitations.  He continues to use CPAP with 100% compliance continues to note significant benefit.  He continues to be active.  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  He has continued to be on losartan 50 mg daily for hypertension.  He continues to be on Zetia 10 mg and omega-3 fatty acid.  He had blood work on 10/15/2017 which showed total cholesterol 171, HDL 36,  LDL 90, triglycerides 225.  He states that he has some dark chocolate every day.  He has a history of GERD and intermittently has used omeprazole.  He presents for one-year evaluation.  Past Medical History:  Diagnosis Date  . Kidney stones     Past Surgical History:  Procedure Laterality Date  . ruptured disc  2002   Dr. Rolley Sims    No Known Allergies  Current Outpatient Medications  Medication Sig Dispense Refill  . aspirin 81 MG tablet Take 81 mg by mouth daily.    . Coenzyme Q10 (EQL COQ10) 300 MG CAPS Take 1  capsule by mouth daily.    [name] ezetimibe (ZETIA) 10 MG tablet TAKE 1 TABLET BY MOUTH EVERY DAY 30 tablet 0  . fish oil-omega-3 fatty acids 1000 MG capsule Take by mouth daily. 1200 MG CAPS. Takes 4 daily.    [name] losartan (COZAAR) 50 MG tablet TAKE 1 TABLET (50 MG TOTAL) BY MOUTH DAILY. 30 tablet 0   No current facility-administered medications for this visit.     Socially, he completed 12th grade education. He is married for 35 years the he works for [employer]. He is the owner and in Press photographer. He exercises approximately 2 times per week doing he routinely does not do cardiac or aerobic workouts. He never smoked tobacco. He denies alcohol use.  Family History  Problem Relation Age of Onset  . Heart attack Mother   . Hyperlipidemia Mother   . Hypertension Mother   . Heart attack Father   . Hyperlipidemia Father   . Colon cancer Maternal Grandmother   . Heart attack Paternal Grandmother   . Heart disease Paternal Grandmother   . Heart attack Paternal Grandfather   . Heart disease Paternal Grandfather     ROS General: Negative; No fevers, chills, or night sweats;  HEENT: Negative; No changes in vision or hearing, sinus congestion, difficulty swallowing Pulmonary: Negative; No cough, wheezing, shortness of breath, hemoptysis Cardiovascular: Negative; No chest pain, presyncope, syncope, palpitations GI: Negative; No nausea, vomiting, diarrhea, or abdominal pain GU: Negative; No dysuria, hematuria, or difficulty voiding Musculoskeletal: Negative; no myalgias, joint pain, or weakness Hematologic/Oncology: Negative; no easy bruising, bleeding Endocrine: Negative; no heat/cold intolerance; no diabetes Neuro: Negative; no changes in balance, headaches Skin: Negative; No rashes or skin lesions Psychiatric: Negative; No behavioral problems, depression Sleep: Positive for obstructive sleep apnea, now on CPAP therapy with resolution of prior nonrestorative sleep,  snoring, and daytime sleepiness;  no bruxism, restless legs, hypnogognic hallucinations, no cataplexy Other comprehensive 14 point system review is negative.   PE BP (!) 130/92   Pulse 62   Ht '6\' 5"'$  (1.956 m)   Wt 228 lb 9.6 oz (103.7 kg)   BMI 27.11 kg/m    Repeat blood pressure by me was 128/84  Wt Readings from Last 3 Encounters:  10/21/17 228 lb 9.6 oz (103.7 kg)  10/14/16 224 lb 9.6 oz (101.9 kg)  12/13/15 237 lb (107.5 kg)   General: Alert, oriented, no distress.  Skin: normal turgor, no rashes, warm and dry HEENT: Normocephalic, atraumatic. Pupils equal round and reactive to light; sclera anicteric; extraocular muscles intact;  Nose without nasal septal hypertrophy Mouth/Parynx benign; Mallinpatti scale 3 Neck: No JVD, no carotid bruits; normal carotid upstroke Lungs: clear to ausculatation and percussion; no wheezing or rales Chest wall: Pectus excavatum without tenderness to palpitation Heart: PMI not displaced, RRR, s1 s2 normal, 1/6 systolic murmur, no diastolic murmur, intermittent  click, no rubs, gallops, thrills, or heaves Abdomen: soft, nontender; no hepatosplenomehaly, BS+; abdominal aorta nontender and not dilated by palpation. Back: no CVA tenderness Pulses 2+ Musculoskeletal: full range of motion, normal strength, no joint deformities Extremities: no clubbing cyanosis or edema, Homan's sign negative  Neurologic: grossly nonfocal; Cranial nerves grossly wnl Psychologic: Normal mood and affect   ECG (independently read by me): normal sinus rhythm at 62 bpm  November 2017 ECG (independently read by me): Normal sinus rhythm at 70 bpm.  September 2016 ECG (independently read by me): Sinus rhythm at 66 bpm.  No ectopy.  Normal intervals.  June 2016 ECG (independently read by me):  Normal sinus rhythm at 68 bpm.  Normal intervals.  Prior ECG: Sinus bradycardia at 59 beats per minute. PR interval 178 ms, QTc interval 403 ms.  LABS: Blood work from [facility] on 09/18/2016 was reviewed.   Hemoglobin 16.8, hematocrit 47.6.  Fasting glucose 120.  BUN 16, Cr1.09.  LFTs normal with an  an AST of 26 and ALT of 31.  Lipid studies revealed total cholesterol 180, triglycerides 194, HDL 32, LDL 109.  Most recent blood work from 10/15/2017 was reviewed.  Total cholesterol 171, HDL 36, LDL 90, triglycerides 225.  Hemoglobin was a 5.6.  Hemoglobin 15.4.  Creatinine 0.96.  TSH 2.5.  ALT 37.   BMP Latest Ref Rng & Units 04/28/2017 12/13/2015 06/12/2015  Glucose 65 - 99 mg/dL 116(H) 116(H) 124(H)  BUN 7 - 25 mg/dL '18 17 14  '$ Creatinine 0.70 - 1.33 mg/dL 1.02 0.94 1.20  Sodium 135 - 146 mmol/L 141 139 141  Potassium 3.5 - 5.3 mmol/L 4.7 4.5 4.2  Chloride 98 - 110 mmol/L 103 102 101  CO2 20 - 31 mmol/L 30 33(H) 28  Calcium 8.6 - 10.3 mg/dL 9.1 9.0 8.9   Hepatic Function Latest Ref Rng & Units 04/28/2017 05/16/2016 01/29/2016  Total Protein 6.1 - 8.1 g/dL 6.7 6.8 7.3  Albumin 3.6 - 5.1 g/dL 4.3 4.4 4.6  AST 10 - 35 U/L 18 27 36(H)  ALT 9 - 46 U/L 21 40 59(H)  Alk Phosphatase 40 - 115 U/L 49 49 57  Total Bilirubin 0.2 - 1.2 mg/dL 0.7 0.8 0.7  Bilirubin, Direct <=0.2 mg/dL - 0.1 0.1   CBC Latest Ref Rng & Units 04/28/2017 06/12/2015 05/25/2013  WBC 3.8 - 10.8 K/uL 5.6 8.6 5.7  Hemoglobin 13.2 - 17.1 g/dL 15.4 16.2 16.4  Hematocrit 38.5 - 50.0 % 45.6 47.6 46.7  Platelets 140 - 400 K/uL 206 193 199   Lab Results  Component Value Date   MCV 92.1 04/28/2017   MCV 90.5 06/12/2015   MCV 89.8 05/25/2013   Lab Results  Component Value Date   TSH 2.50 04/28/2017   Lab Results  Component Value Date   HGBA1C 5.6 04/28/2017     Lipid Panel     Component Value Date/Time   CHOL 141 04/28/2017 0908   CHOL 166 05/25/2013 0855   TRIG 163 (H) 04/28/2017 0908   TRIG 179 (H) 05/25/2013 0855   HDL 33 (L) 04/28/2017 0908   HDL 33 (L) 05/25/2013 0855   CHOLHDL 4.3 04/28/2017 0908   VLDL 33 (H) 04/28/2017 0908   LDLCALC 75 04/28/2017 0908   LDLCALC  97 05/25/2013 0855   IMPRESSION:  1. Essential hypertension   2. Hyperlipidemia with target LDL less than 100   3. OSA (obstructive sleep apnea)   4. Family history of premature CAD  5. Gastroesophageal reflux disease without esophagitis     ASSESSMENT AND PLAN:  Bruce [name] is a 56 year old Caucasian male who has a strong  family history for coronary artery disease with both parents having had heart attacks. His father suffered his initial heart attack at age 51 and subsequently had CABG surgery x3 and also has stents as well as a defibrillator.  In 2014, he had a negative adequate graded exercise treadmill test which was done after his mother had suffered a heart attack.  An echo Doppler study showed normal systolic function with mild diastolic relaxation abnormality and a mildly thickened mitral valve with systolic bowing without definitive prolapse. .  Presently, his blood pressure is stable and on repeat by me was 128/84.  On his current dose of losartan 50 mg daily.  He has a history of hyperlipidemia and has been taking Zetia in addition to omega-3 fatty acids over-the-counter.  I suspect his recent increasing triglycerides is related to his daily dark chocolate use and we discussed limiting this.  We also discussed potential rechallenge of statins.  Reportedly in the past he may have had issues with atorvastatin causing mild LFT elevation.  If his LDL does  not significant improve with improved diet, he may benefit from a trial of rosuvastatin 5 -10 mg at least on a weekly basis to take in addition to his Zetia and omega-3 fatty acid.  He is not having any anginal symptomatology.  We discussed increasing aerobic activity and ideally he should exercise at least 5 days per week for up to 30 minutes.  He continues to use CPAP with 100% compliance.  There is no breakthrough snoring.  His sleep is restorative.  In addition, with CPAP therapy.  His prior GERD symptoms have improved such that he is now  no longer routinely taking medication for this.  He'll follow-up with his primary M.D. who will recheck laboratory.  As long as he remains stable I will see him in one year for reevaluation. Time spent: 25 minutes  Troy Sine, MD, [facility] 10/23/2017 9:58 AM

## 2017-10-21 NOTE — Patient Instructions (Signed)

## 2017-10-23 ENCOUNTER — Encounter: Payer: Self-pay | Admitting: Cardiovascular Disease

## 2017-10-26 ENCOUNTER — Other Ambulatory Visit: Payer: Self-pay | Admitting: *Deleted

## 2017-10-26 MED ORDER — EZETIMIBE 10 MG PO TABS: 10.0000 mg | ORAL_TABLET | Freq: Every day | ORAL | 3 refills | Status: DC

## 2017-10-26 MED ORDER — LOSARTAN POTASSIUM 50 MG PO TABS: 50.0000 mg | ORAL_TABLET | Freq: Every day | ORAL | 3 refills | Status: DC

## 2018-01-04 ENCOUNTER — Telehealth (INDEPENDENT_AMBULATORY_CARE_PROVIDER_SITE_OTHER): Payer: Self-pay | Admitting: Orthopaedic Surgery

## 2018-01-04 NOTE — Telephone Encounter (Signed)
FYI Patient walked in to schedule appointment, Dr. Magnus IvanBlackman had discussed giving him a bursitis injection of his left hip. Patient would like to proceed, his appt is 2/11 and his insurance has been updated for 2019.

## 2018-01-11 ENCOUNTER — Encounter (INDEPENDENT_AMBULATORY_CARE_PROVIDER_SITE_OTHER): Payer: Self-pay | Admitting: Orthopaedic Surgery

## 2018-01-11 ENCOUNTER — Ambulatory Visit (INDEPENDENT_AMBULATORY_CARE_PROVIDER_SITE_OTHER): Payer: BLUE CROSS/BLUE SHIELD | Admitting: Orthopaedic Surgery

## 2018-01-11 DIAGNOSIS — M7062 Trochanteric bursitis, left hip: Secondary | ICD-10-CM | POA: Diagnosis not present

## 2018-01-11 MED ORDER — LIDOCAINE HCL 1 % IJ SOLN
3.0000 mL | INTRAMUSCULAR | Status: AC | PRN
  Administered 2018-01-11: 3 mL

## 2018-01-11 MED ORDER — METHYLPREDNISOLONE ACETATE 40 MG/ML IJ SUSP
40.0000 mg | INTRAMUSCULAR | Status: AC | PRN
  Administered 2018-01-11: 40 mg via INTRA_ARTICULAR

## 2018-01-11 NOTE — Progress Notes (Signed)
Office Visit Note   Patient: Bruce [name]           Date of Birth: 20-Jan-1961           MRN: [id number] Visit Date: 01/11/2018              Requested by: Daisy Florooss, Charles Alan, MD [address] [place], [place] [address] PCP: Daisy Florooss, Charles Alan, MD   Assessment & Plan: Visit Diagnoses:  1. Trochanteric bursitis, left hip     Plan: I do feel that he would benefit from a trochanteric injection on the left side and he tolerated this well.  Also do feel that he would benefit from a one-time intra-articular injection in his right hip joint under direct fluoroscopy by Dr. Alvester MorinNewton.  We will work on getting this set up.  I would then see him back myself in 4 weeks to see how he is doing overall.  All questions concerns were answered and addressed.  Follow-Up Instructions: Return in about 4 weeks (around 02/08/2018).   Orders:  Orders Placed This Encounter  Procedures  . Large Joint Inj   No orders of the defined types were placed in this encounter.     Procedures: Large Joint Inj: L greater trochanter on 01/11/2018 8:37 AM Indications: pain and diagnostic evaluation Details: 22 G 1.5 in needle, lateral approach  Arthrogram: No  Medications: 3 mL lidocaine 1 %; 40 mg methylPREDNISolone acetate 40 MG/ML Outcome: tolerated well, no immediate complications Procedure, treatment alternatives, risks and benefits explained, specific risks discussed. Consent was given by the patient. Immediately prior to procedure a time out was called to verify the correct patient, procedure, equipment, support staff and site/side marked as required. Patient was prepped and draped in the usual sterile fashion.       Clinical Data: No additional findings.   Subjective: Chief Complaint  Patient presents with  . Left Hip - Pain  The patient is someone who I saw her hip pain in November 2017.  We felt that this was more of a trochanteric bursitis at the time.  His x-rays were normal of his left hip.   He never got any type of injection.  He says he hurts mainly on the side of his hip but it does radiate into the groin.  He says he most notices it when he is been sitting for long period time and driving for long period time and gets up to walk.  He says he does not have trouble with stiffness did not have trouble crossing his legs are getting his shoes off and on.  He denies any change in bowel bladder function.  He is not a diabetic.  Denies any recent traumas.  HPI  Review of Systems He currently denies any headache, chest pain, shortness of breath, fever, chills, nausea, vomiting.  He is alert and oriented x3 and in no acute distress  Objective: Vital Signs: There were no vitals taken for this visit.  Physical Exam He is alert and oriented x3 and in no acute distress Ortho Exam Examination of his left hip shows some mild pain of the trochanteric area with good range of motion of the hip and pain some in the groin with extremes of rotation. Specialty Comments:  No specialty comments available.  Imaging: No results found. No x-rays were performed of his left hip today but I did review x-rays from 2017 that showed no significant arthritic changes and a well-maintained hip joint space of the left  hip.  PMFS History: Patient Active Problem List   Diagnosis Date Noted  . Trochanteric bursitis, left hip 01/11/2018  . Abnormal glucose level 10/14/2016  . Gastroesophageal reflux disease without esophagitis 10/14/2016  . Essential hypertension 12/13/2015  . OSA (obstructive sleep apnea) 08/29/2015  . Hyperlipidemia with target LDL less than 100 04/26/2013  . Family history of premature CAD 04/26/2013  . Snoring 04/26/2013   Past Medical History:  Diagnosis Date  . Kidney stones     Family History  Problem Relation Age of Onset  . Heart attack Mother   . Hyperlipidemia Mother   . Hypertension Mother   . Heart attack Father   . Hyperlipidemia Father   . Colon cancer Maternal  Grandmother   . Heart attack Paternal Grandmother   . Heart disease Paternal Grandmother   . Heart attack Paternal Grandfather   . Heart disease Paternal Grandfather     Past Surgical History:  Procedure Laterality Date  . ruptured disc  2002   Dr. Jabier Gauss   Social History   Occupational History  . Not on file  Tobacco Use  . Smoking status: Never Smoker  . Smokeless tobacco: Never Used  Substance and Sexual Activity  . Alcohol use: Not on file  . Drug use: Not on file  . Sexual activity: Not on file

## 2018-01-12 ENCOUNTER — Other Ambulatory Visit (INDEPENDENT_AMBULATORY_CARE_PROVIDER_SITE_OTHER): Payer: Self-pay

## 2018-01-12 DIAGNOSIS — M25552 Pain in left hip: Secondary | ICD-10-CM

## 2018-01-26 ENCOUNTER — Encounter (INDEPENDENT_AMBULATORY_CARE_PROVIDER_SITE_OTHER): Payer: Self-pay | Admitting: Physical Medicine and Rehabilitation

## 2018-01-26 ENCOUNTER — Ambulatory Visit (INDEPENDENT_AMBULATORY_CARE_PROVIDER_SITE_OTHER): Payer: BLUE CROSS/BLUE SHIELD

## 2018-01-26 ENCOUNTER — Ambulatory Visit (INDEPENDENT_AMBULATORY_CARE_PROVIDER_SITE_OTHER): Payer: BLUE CROSS/BLUE SHIELD | Admitting: Physical Medicine and Rehabilitation

## 2018-01-26 DIAGNOSIS — M25552 Pain in left hip: Secondary | ICD-10-CM | POA: Diagnosis not present

## 2018-01-26 NOTE — Progress Notes (Deleted)
Pt states sharp pain in left hip and left groin. Pt states pain started a couple of years ago. Pt states standing up from a sitting position, getting in and out of the car makes pain worse. -Dye Allergies.

## 2018-01-26 NOTE — Patient Instructions (Signed)

## 2018-01-26 NOTE — Progress Notes (Signed)
Bruce [name] - 57 y.o. male MRN [id number]  Date of birth: [birth year]  Office Visit Note: Visit Date: 01/26/2018 PCP: Daisy Floro, MD Referred by: Daisy Floro, MD  Subjective: Chief Complaint  Patient presents with  . Left Hip - Pain   HPI: Bruce [name] is a 57 year old gentleman has been followed by Dr. Magnus Ivan.  He evidently has had fairly normal-appearing x-rays but continues to have more left lateral hip pain but today also endorses some left groin pain.  He reports that his pain started a couple years ago.  He gets a lot of symptoms standing up from a seated position and getting in and out of the car makes his pain worse.  We are going to complete a diagnostic and hopefully therapeutic anesthetic hip arthrogram on the left.    ROS Otherwise per HPI.  Assessment & Plan: Visit Diagnoses:  1. Pain in left hip     Plan: Findings:  Diagnostic and hopefully therapeutic left anesthetic arthrogram of the hip.  Patient did seem to have relief during the anesthetic phase.    Meds & Orders: No orders of the defined types were placed in this encounter.  No orders of the defined types were placed in this encounter.   Follow-up: No Follow-up on file.   Procedures: Large Joint Inj: L hip joint on 01/26/2018 2:24 PM Indications: pain and diagnostic evaluation Details: 22 G needle, anterior approach  Arthrogram: Yes  Medications: 80 mg triamcinolone acetonide 40 MG/ML; 3 mL bupivacaine 0.5 % Outcome: tolerated well, no immediate complications  Arthrogram demonstrated excellent flow of contrast throughout the joint surface without extravasation or obvious defect.  The patient had relief of symptoms during the anesthetic phase of the injection.  Procedure, treatment alternatives, risks and benefits explained, specific risks discussed. Consent was given by the patient. Immediately prior to procedure a time out was called to verify the correct patient, procedure, equipment,  support staff and site/side marked as required. Patient was prepped and draped in the usual sterile fashion.      No notes on file   Clinical History: No specialty comments available.  He reports that  has never smoked. he has never used smokeless tobacco.  Recent Labs    04/28/17 0908  HGBA1C 5.6    Objective:  VS:  HT:    WT:   BMI:     BP:   HR: bpm  TEMP: ( )  RESP:  Physical Exam  Musculoskeletal:  Patient had decent range of motion of the left hip but did have pain at end ranges with the groin and lateral hip.    Ortho Exam Imaging: No results found.  Past Medical/Family/Surgical/Social History: Medications & Allergies reviewed per EMR Patient Active Problem List   Diagnosis Date Noted  . Trochanteric bursitis, left hip 01/11/2018  . Abnormal glucose level 10/14/2016  . Gastroesophageal reflux disease without esophagitis 10/14/2016  . Essential hypertension 12/13/2015  . OSA (obstructive sleep apnea) 08/29/2015  . Hyperlipidemia with target LDL less than 100 04/26/2013  . Family history of premature CAD 04/26/2013  . Snoring 04/26/2013   Past Medical History:  Diagnosis Date  . Kidney stones    Family History  Problem Relation Age of Onset  . Heart attack Mother   . Hyperlipidemia Mother   . Hypertension Mother   . Heart attack Father   . Hyperlipidemia Father   . Colon cancer Maternal Grandmother   . Heart attack Paternal Grandmother   .  Heart disease Paternal Grandmother   . Heart attack Paternal Grandfather   . Heart disease Paternal Grandfather    Past Surgical History:  Procedure Laterality Date  . ruptured disc  2002   Dr. Jabier GaussGREG YATES   Social History   Occupational History  . Not on file  Tobacco Use  . Smoking status: Never Smoker  . Smokeless tobacco: Never Used  Substance and Sexual Activity  . Alcohol use: Not on file  . Drug use: Not on file  . Sexual activity: Not on file

## 2018-02-02 MED ORDER — TRIAMCINOLONE ACETONIDE 40 MG/ML IJ SUSP
80.0000 mg | INTRAMUSCULAR | Status: AC | PRN
  Administered 2018-01-26: 80 mg via INTRA_ARTICULAR

## 2018-02-02 MED ORDER — BUPIVACAINE HCL 0.5 % IJ SOLN
3.0000 mL | INTRAMUSCULAR | Status: AC | PRN
  Administered 2018-01-26: 3 mL via INTRA_ARTICULAR

## 2018-02-08 ENCOUNTER — Ambulatory Visit (INDEPENDENT_AMBULATORY_CARE_PROVIDER_SITE_OTHER): Payer: BLUE CROSS/BLUE SHIELD | Admitting: Orthopaedic Surgery

## 2018-10-17 ENCOUNTER — Other Ambulatory Visit: Payer: Self-pay | Admitting: Cardiovascular Disease

## 2018-11-11 ENCOUNTER — Other Ambulatory Visit: Payer: Self-pay | Admitting: Cardiovascular Disease

## 2018-11-11 ENCOUNTER — Other Ambulatory Visit: Payer: Self-pay

## 2018-11-11 DIAGNOSIS — E785 Hyperlipidemia, unspecified: Secondary | ICD-10-CM

## 2018-11-11 DIAGNOSIS — R7309 Other abnormal glucose: Secondary | ICD-10-CM

## 2018-11-11 DIAGNOSIS — R739 Hyperglycemia, unspecified: Secondary | ICD-10-CM | POA: Diagnosis not present

## 2018-11-11 DIAGNOSIS — I10 Essential (primary) hypertension: Secondary | ICD-10-CM

## 2018-11-12 LAB — COMPLETE METABOLIC PANEL WITH GFR
AG Ratio: 1.7 (calc) (ref 1.0–2.5)
ALBUMIN MSPROF: 4.3 g/dL (ref 3.6–5.1)
ALT: 46 U/L (ref 9–46)
AST: 28 U/L (ref 10–35)
Alkaline phosphatase (APISO): 45 U/L (ref 40–115)
BUN: 19 mg/dL (ref 7–25)
CO2: 27 mmol/L (ref 20–32)
Calcium: 9.2 mg/dL (ref 8.6–10.3)
Chloride: 105 mmol/L (ref 98–110)
Creat: 1 mg/dL (ref 0.70–1.33)
GFR, Est African American: 95 mL/min/{1.73_m2} (ref >=60)
GFR, Est Non African American: 82 mL/min/{1.73_m2} (ref >=60)
Globulin: 2.6 g/dL (calc) (ref 1.9–3.7)
Glucose, Bld: 118 mg/dL — ABNORMAL HIGH (ref 65–99)
POTASSIUM: 4.6 mmol/L (ref 3.5–5.3)
Sodium: 140 mmol/L (ref 135–146)
Total Bilirubin: 0.5 mg/dL (ref 0.2–1.2)
Total Protein: 6.9 g/dL (ref 6.1–8.1)

## 2018-11-12 LAB — LIPID PANEL
Cholesterol: 142 mg/dL (ref <200)
HDL: 35 mg/dL — ABNORMAL LOW (ref >40)
LDL Cholesterol (Calc): 88 mg/dL (calc)
Non-HDL Cholesterol (Calc): 107 mg/dL (calc) (ref <130)
Total CHOL/HDL Ratio: 4.1 (calc) (ref <5.0)
Triglycerides: 99 mg/dL (ref <150)

## 2018-11-12 LAB — TSH: TSH: 1.91 mIU/L (ref 0.40–4.50)

## 2018-11-12 LAB — HEMOGLOBIN A1C W/OUT EAG: HEMOGLOBIN A1C: 5.7 %{Hb} — AB (ref <5.7)

## 2018-11-15 ENCOUNTER — Encounter: Payer: Self-pay | Admitting: *Deleted

## 2018-11-17 ENCOUNTER — Encounter: Payer: Self-pay | Admitting: Cardiovascular Disease

## 2018-11-17 ENCOUNTER — Ambulatory Visit (INDEPENDENT_AMBULATORY_CARE_PROVIDER_SITE_OTHER): Payer: BLUE CROSS/BLUE SHIELD | Admitting: Cardiovascular Disease

## 2018-11-17 VITALS — BP 156/88 | HR 71 | Ht 77.0 in | Wt 239.2 lb

## 2018-11-17 DIAGNOSIS — G4733 Obstructive sleep apnea (adult) (pediatric): Secondary | ICD-10-CM

## 2018-11-17 DIAGNOSIS — Z8249 Family history of ischemic heart disease and other diseases of the circulatory system: Secondary | ICD-10-CM | POA: Diagnosis not present

## 2018-11-17 DIAGNOSIS — I10 Essential (primary) hypertension: Secondary | ICD-10-CM | POA: Diagnosis not present

## 2018-11-17 DIAGNOSIS — E785 Hyperlipidemia, unspecified: Secondary | ICD-10-CM

## 2018-11-17 MED ORDER — OLMESARTAN MEDOXOMIL 20 MG PO TABS: 20.0000 mg | ORAL_TABLET | Freq: Every day | ORAL | 3 refills | Status: DC

## 2018-11-17 NOTE — Progress Notes (Signed)
Patient ID: Bruce [name], male   DOB: 07-21-1961, 57 y.o.   MRN: [id number]   Primary M.D.: Dr. Bronson Ing  HPI: [name] denies is 37 white male presents to the office for 12 month follow-up cardiology evaluation.  [name] a strong family history for coronary artery disease and denies any known CAD or episodes of chest pain. He states that over the years he has had slight progression of  cholesterol elevation but has consistently had low good cholesterol levels. He is followed by Dr. Serina Cowper.  Laboratory August 2013 showed a total cholesterol 178, triglycerides 196, HDL cholesterol 33, non-HDL cholesterol 145 and LDL cholesterol 113. He had never been on lipid lowering therapy and at that time did not take any medications with the exception of aspirin and over-the-counter fish oil.    In 2014, after his mother had suffered a heart attack, he underwent a routine treadmill test.  His was interpreted as negative, adequate Bruce treadmill test and he was able to exercise for 13.7 that workload and achieving AP, rate of 102% of age-predicted maximum heart rate.  He did not have diagnostic ST changes of ischemia.  He underwent an echo Doppler study on 05/17/2013 which revealed normal systolic function.  There was grade 1 diastolic dysfunction.  He had mild thickening of his mitral valve with systolic bowing without definitive prolapse.  Over the past several years, he has continued to be active.  He denies any exertional precipitation of chest pain.  He denies any change in exercise tolerance.  He was started on atorvastatin 20 mg and takes omega-3 fatty acids as well as coenzyme Q10 for hyperlipidemia.  Due to complaints of significant deterioration of his sleep quality with loud snoring and nonrestorative sleep.  I referred him for sleep study  which was done on 07/30/2015.  He was found to have mild obstructive sleep apnea with an AHI overall of 7.3 per hour.  However, he had a severe  positional component with severe sleep apnea with supine position with an AHI of 51.4 per hour.  There was mild oxygen desaturation to a nadir of 86%. There was loud snoring.  He underwent a CPAP titration trial on 08/21/2015 and ultimately his obstructive apnea was improved at a CPAP pressure of 15.  However, with his previous documentation of a marked positional component to his sleep apnea a CPAP auto unit was recommended.  CPAP was instituted on 10/04/2015.  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  When I saw him in January 2017 he was meeting Medicare compliance standards with 100% use.  He notes significantly more energy.  He denies any residual daytime sleepiness.  He is unaware of breakthrough snoring.  He denies any nocturnal palpitations.  I last saw him in November 2018 at which time he remained stable and he denied any episodes of chest pain shortness of breath or palpitations.  He was continuing to use CPAP with 100% compliance and is continued to note significant benefit.    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  He has continued to be on losartan 50 mg daily for hypertension.  He continues to be on Zetia 10 mg and omega-3 fatty acid.  Laboratory on  10/15/2017  showed total cholesterol 171, HDL 36, LDL 90, triglycerides 225.  He states that he has some dark chocolate every  day.  He has a history of GERD and intermittently has used omeprazole.    Over the past year, he has remained stable.  He denies any chest pain PND orthopnea.  Earlier this year he had issues with left hip pain and was diagnosed as having trochanteric bursitis.   [facility] is his DME company and he uses CPAP with 100% compliance.  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  He has not had recent  laboratory.  He presents for evaluation.   Past Medical History:  Diagnosis Date  . Kidney stones     Past Surgical History:  Procedure Laterality Date  . ruptured disc  2002   Dr. Rolley Sims    No Known Allergies  Current Outpatient Medications  Medication Sig Dispense Refill  . aspirin 81 MG tablet Take 81 mg by mouth daily.    . Coenzyme Q10 (EQL COQ10) 300 MG CAPS Take 1 capsule by mouth daily.    [name] ezetimibe (ZETIA) 10 MG tablet TAKE 1 TABLET BY MOUTH EVERY DAY 90 tablet 1  . fish oil-omega-3 fatty acids 1000 MG capsule Take by mouth daily. 1200 MG CAPS. Takes 4 daily.    [name] olmesartan (BENICAR) 20 MG tablet Take 1 tablet (20 mg total) by mouth daily. 90 tablet 3   No current facility-administered medications for this visit.     Socially, he completed 12th grade education. He is married for 35 years the he works for [employer]. He is the owner and in Press photographer. He exercises approximately 2 times per week doing he routinely does not do cardiac or aerobic workouts. He never smoked tobacco. He denies alcohol use.  Family History  Problem Relation Age of Onset  . Heart attack Mother   . Hyperlipidemia Mother   . Hypertension Mother   . Heart attack Father   . Hyperlipidemia Father   . Colon cancer Maternal Grandmother   . Heart attack Paternal Grandmother   . Heart disease Paternal Grandmother   . Heart attack Paternal Grandfather   . Heart disease Paternal Grandfather     ROS General: Negative; No fevers, chills, or night sweats;  HEENT: Negative; No changes in vision or hearing, sinus congestion, difficulty swallowing Pulmonary: Negative; No cough, wheezing, shortness of breath, hemoptysis Cardiovascular: Negative; No chest pain, presyncope, syncope, palpitations GI: Negative; No nausea, vomiting, diarrhea, or abdominal pain GU: Negative; No dysuria, hematuria, or difficulty voiding Musculoskeletal: Negative; no myalgias, joint pain, or  weakness Hematologic/Oncology: Negative; no easy bruising, bleeding Endocrine: Negative; no heat/cold intolerance; no diabetes Neuro: Negative; no changes in balance, headaches Skin: Negative; No rashes or skin lesions Psychiatric: Negative; No behavioral problems, depression Sleep: Positive for obstructive sleep apnea, now on CPAP therapy with resolution of prior nonrestorative sleep, snoring, and daytime sleepiness;  no bruxism, restless legs, hypnogognic hallucinations, no cataplexy Other comprehensive 14 point system review is negative.   PE BP (!) 156/88   Pulse 71   Ht '6\' 5"'$  (1.956 m)   Wt 239 lb 3.2 oz (108.5 kg)   BMI 28.36 kg/m    Repeat blood pressure by me was 142/86 supine and 144/84 standing  Wt Readings from Last 3 Encounters:  11/17/18 239 lb 3.2 oz (108.5 kg)  10/21/17 228 lb 9.6 oz (103.7 kg)  10/14/16 224 lb 9.6 oz (101.9 kg)   General: Alert,  oriented, no distress.  Skin: normal turgor, no rashes, warm and dry HEENT: Normocephalic, atraumatic. Pupils equal round and reactive to light; sclera anicteric; extraocular muscles intact; Nose without nasal septal hypertrophy Mouth/Parynx benign; Mallinpatti scale 3 Neck: No JVD, no carotid bruits; normal carotid upstroke Lungs: clear to ausculatation and percussion; no wheezing or rales Chest wall: without tenderness to palpitation Heart: PMI not displaced, RRR, s1 s2 normal, 1/6 systolic murmur, no diastolic murmur, no rubs, gallops, thrills, or heaves Abdomen: soft, nontender; no hepatosplenomehaly, BS+; abdominal aorta nontender and not dilated by palpation. Back: no CVA tenderness Pulses 2+ Musculoskeletal: full range of motion, normal strength, no joint deformities Extremities: no clubbing cyanosis or edema, Homan's sign negative  Neurologic: grossly nonfocal; Cranial nerves grossly wnl Psychologic: Normal mood and affect   ECG (independently read by me): Normal sinus rhythm at 71 bpm.  No ectopy.  Normal  intervals.  November 2018 ECG (independently read by me): normal sinus rhythm at 62 bpm  November 2017 ECG (independently read by me): Normal sinus rhythm at 70 bpm.  September 2016 ECG (independently read by me): Sinus rhythm at 66 bpm.  No ectopy.  Normal intervals.  June 2016 ECG (independently read by me):  Normal sinus rhythm at 68 bpm.  Normal intervals.  Prior ECG: Sinus bradycardia at 59 beats per minute. PR interval 178 ms, QTc interval 403 ms.  LABS: Blood work from [facility] on 09/18/2016 was reviewed.   Hemoglobin 16.8, hematocrit 47.6.  Fasting glucose 120.  BUN 16, Cr1.09.  LFTs normal with an  an AST of 26 and ALT of 31.  Lipid studies revealed total cholesterol 180, triglycerides 194, HDL 32, LDL 109.  Most recent blood work from 10/15/2017 was reviewed.  Total cholesterol 171, HDL 36, LDL 90, triglycerides 225.  Hemoglobin was a 5.6.  Hemoglobin 15.4.  Creatinine 0.96.  TSH 2.5.  ALT 37.   BMP Latest Ref Rng & Units 11/11/2018 04/28/2017 12/13/2015  Glucose 65 - 99 mg/dL 118(H) 116(H) 116(H)  BUN 7 - 25 mg/dL '19 18 17  '$ Creatinine 0.70 - 1.33 mg/dL 1.00 1.02 0.94  BUN/Creat Ratio 6 - 22 (calc) NOT APPLICABLE - -  Sodium 937 - 146 mmol/L 140 141 139  Potassium 3.5 - 5.3 mmol/L 4.6 4.7 4.5  Chloride 98 - 110 mmol/L 105 103 102  CO2 20 - 32 mmol/L 27 30 33(H)  Calcium 8.6 - 10.3 mg/dL 9.2 9.1 9.0   Hepatic Function Latest Ref Rng & Units 11/11/2018 04/28/2017 05/16/2016  Total Protein 6.1 - 8.1 g/dL 6.9 6.7 6.8  Albumin 3.6 - 5.1 g/dL - 4.3 4.4  AST 10 - 35 U/L '28 18 27  '$ ALT 9 - 46 U/L 46 21 40  Alk Phosphatase 40 - 115 U/L - 49 49  Total Bilirubin 0.2 - 1.2 mg/dL 0.5 0.7 0.8  Bilirubin, Direct <=0.2 mg/dL - - 0.1   CBC Latest Ref Rng & Units 04/28/2017 06/12/2015 05/25/2013  WBC 3.8 - 10.8 K/uL 5.6 8.6 5.7  Hemoglobin 13.2 - 17.1 g/dL 15.4 16.2 16.4  Hematocrit 38.5 - 50.0 % 45.6 47.6 46.7  Platelets 140 - 400 K/uL 206 193 199   Lab Results  Component  Value Date   MCV 92.1 04/28/2017   MCV 90.5 06/12/2015   MCV 89.8 05/25/2013   Lab Results  Component Value Date   TSH 1.91 11/11/2018   Lab Results  Component Value Date   HGBA1C 5.7 (H) 11/11/2018     Lipid  Panel     Component Value Date/Time   CHOL 142 11/11/2018 1059   CHOL 166 05/25/2013 0855   TRIG 99 11/11/2018 1059   TRIG 179 (H) 05/25/2013 0855   HDL 35 (L) 11/11/2018 1059   HDL 33 (L) 05/25/2013 0855   CHOLHDL 4.1 11/11/2018 1059   VLDL 33 (H) 04/28/2017 0908   LDLCALC 88 11/11/2018 1059   LDLCALC 97 05/25/2013 0855   IMPRESSION:  1. Essential hypertension   2. Family history of premature CAD   3. OSA (obstructive sleep apnea)   4. Hyperlipidemia with target LDL less than 100     ASSESSMENT AND PLAN:  [name] is a 57 year old Caucasian male who has a strong family history for coronary artery disease with both parents having had heart attacks. His father suffered his initial heart attack at age 5 and subsequently had CABG surgery x3 and also has stents as well as a defibrillator.  In 2014, he had a negative adequate graded exercise treadmill test which was done after his mother had suffered a heart attack.  An echo Doppler study showed normal systolic function with mild diastolic relaxation abnormality and a mildly thickened mitral valve with systolic bowing without definitive prolapse.  Most recently, he has been on losartan 50 mg daily.  There have been some issues with contamination.  As result, I have recommended he switch to olmesartan 20 mg.  He will be seeing Dr. Harrington Challenger who can follow-up his blood pressure.  Ideal blood pressure is less than 130/80 and he may require further dose titration of 40 mg if he is not at target.  He continues to use CPAP with 100% compliance.  He denies breakthrough snoring.  He has been on Zetia 10 mg and omega-3 fatty acids for hyperlipidemia.  Most recent laboratory on November 11, 2018 showed a total cholesterol 142 HDL 35  triglycerides 99 and LDL cholesterol was 88.  He has normal renal function.  In the past he may have had issues with atorvastatin causing mild LFT elevation.  I have recommended continue exercise at least 5 days/week for at least 30 minutes if at all possible.  I will see him in 6 months for follow-up evaluation.  Time spent: 25 minutes  Troy Sine, MD, [facility] 11/18/2018 8:30 PM

## 2018-11-17 NOTE — Patient Instructions (Addendum)
Medication Instructions:  Stop Losartan Start Olmesartan 20 mg daily. Monitor BP is it staying above 140 let us know, but have Dr.Ross check at next appointment in January.  If you need a refill on your cardiac medications before your next appointment, please call your pharmacy.    Follow-Up: At [facility], you and your health needs are our priority.  As part of our continuing mission to provide you with exceptional heart care, we have created designated Provider Care Teams.  These Care Teams include your primary Cardiologist (physician) and Advanced Practice Providers (APPs -  Physician Assistants and Nurse Practitioners) who all work together to provide you with the care you need, when you need it. You will need a follow up appointment in 6 months.  Please call our office 2 months in advance to schedule this appointment.  You may see Dr.Kelly or one of the following Advanced Practice Providers on your designated Care Team: Azalee CourseHao Meng, [place] JerseyPA-C . Micah FlesherAngela Duke, PA-C

## 2018-11-18 ENCOUNTER — Encounter: Payer: Self-pay | Admitting: Cardiovascular Disease

## 2018-12-10 DIAGNOSIS — Z0000 Encounter for general adult medical examination without abnormal findings: Secondary | ICD-10-CM | POA: Diagnosis not present

## 2018-12-14 DIAGNOSIS — Z0000 Encounter for general adult medical examination without abnormal findings: Secondary | ICD-10-CM | POA: Diagnosis not present

## 2018-12-14 DIAGNOSIS — Z23 Encounter for immunization: Secondary | ICD-10-CM | POA: Diagnosis not present

## 2019-01-03 DIAGNOSIS — G4733 Obstructive sleep apnea (adult) (pediatric): Secondary | ICD-10-CM | POA: Diagnosis not present

## 2019-01-27 ENCOUNTER — Other Ambulatory Visit: Payer: Self-pay | Admitting: Cardiovascular Disease

## 2019-01-28 DIAGNOSIS — Z0000 Encounter for general adult medical examination without abnormal findings: Secondary | ICD-10-CM | POA: Diagnosis not present

## 2019-01-28 DIAGNOSIS — R748 Abnormal levels of other serum enzymes: Secondary | ICD-10-CM | POA: Diagnosis not present

## 2019-01-28 NOTE — Telephone Encounter (Signed)
Rx(s) sent to pharmacy electronically.  

## 2019-04-16 ENCOUNTER — Other Ambulatory Visit: Payer: Self-pay | Admitting: Cardiovascular Disease

## 2019-04-28 ENCOUNTER — Telehealth: Payer: Self-pay | Admitting: Cardiovascular Disease

## 2019-04-28 NOTE — Telephone Encounter (Signed)
smartphone/ my chart via emailed/ consent/ pre reg completed  °

## 2019-04-29 DIAGNOSIS — D225 Melanocytic nevi of trunk: Secondary | ICD-10-CM | POA: Diagnosis not present

## 2019-04-29 DIAGNOSIS — L570 Actinic keratosis: Secondary | ICD-10-CM | POA: Diagnosis not present

## 2019-04-29 DIAGNOSIS — Z1283 Encounter for screening for malignant neoplasm of skin: Secondary | ICD-10-CM | POA: Diagnosis not present

## 2019-04-29 DIAGNOSIS — X32XXXD Exposure to sunlight, subsequent encounter: Secondary | ICD-10-CM | POA: Diagnosis not present

## 2019-05-02 ENCOUNTER — Telehealth (INDEPENDENT_AMBULATORY_CARE_PROVIDER_SITE_OTHER): Payer: BLUE CROSS/BLUE SHIELD | Admitting: Cardiovascular Disease

## 2019-05-02 VITALS — Ht 77.0 in | Wt 238.0 lb

## 2019-05-02 DIAGNOSIS — Z8249 Family history of ischemic heart disease and other diseases of the circulatory system: Secondary | ICD-10-CM

## 2019-05-02 DIAGNOSIS — G4733 Obstructive sleep apnea (adult) (pediatric): Secondary | ICD-10-CM | POA: Diagnosis not present

## 2019-05-02 DIAGNOSIS — I10 Essential (primary) hypertension: Secondary | ICD-10-CM | POA: Diagnosis not present

## 2019-05-02 DIAGNOSIS — E785 Hyperlipidemia, unspecified: Secondary | ICD-10-CM | POA: Diagnosis not present

## 2019-05-02 MED ORDER — OLMESARTAN MEDOXOMIL 40 MG PO TABS: 40.0000 mg | ORAL_TABLET | Freq: Every day | ORAL | 1 refills | Status: DC

## 2019-05-02 NOTE — Progress Notes (Signed)
Virtual Visit via Telephone Note   This visit type was conducted due to national recommendations for restrictions regarding the COVID-19 Pandemic (e.g. social distancing) in an effort to limit this patient's exposure and mitigate transmission in our community.  Due to his co-morbid illnesses, this patient is at least at moderate risk for complications without adequate follow up.  This format is felt to be most appropriate for this patient at this time.  The patient did not have access to video technology/had technical difficulties with video requiring transitioning to audio format only (telephone).  All issues noted in this document were discussed and addressed.  No physical exam could be performed with this format.  Please refer to the patient's chart for his  consent to telehealth for [facility].   Date:  05/02/2019   ID:  Bruce [name], DOB 07-26-1961, MRN [id number]  Patient Location: Home Provider Location: Office  PCP:  Daisy Florooss, Charles Alan, MD  Cardiologist:  Nicki Guadalajarahomas Brant Peets, MD Electrophysiologist:  None   Evaluation Performed:  Follow-Up Visit  Chief Complaint:  LOV 11/17/2108  History of Present Illness:    Bruce PulseScott A [name] is a 58 y.o. male who has a strong family history for coronary artery disease and denies any known CAD or episodes of chest pain. He states that over the years he has had slight progression of  cholesterol elevation but has consistently had low good cholesterol levels. He is followed by Dr. Judithann Sauger. Allen Ross.  Laboratory August 2013 showed a total cholesterol 178, triglycerides 196, HDL cholesterol 33, non-HDL cholesterol 145 and LDL cholesterol 113. He had never been on lipid lowering therapy and at that time did not take any medications with the exception of aspirin and over-the-counter fish oil.    In 2014, after his mother had suffered a heart attack, he underwent a routine treadmill test.  His was interpreted as negative, adequate Bruce treadmill test and he was  able to exercise for 13.7 that workload and achieving AP, rate of 102% of age-predicted maximum heart rate.  He did not have diagnostic ST changes of ischemia.  He underwent an echo Doppler study on 05/17/2013 which revealed normal systolic function.  There was grade 1 diastolic dysfunction.  He had mild thickening of his mitral valve with systolic bowing without definitive prolapse.  Over the past several years, he has continued to be active.  He denies any exertional precipitation of chest pain.  He denies any change in exercise tolerance.  He was started on atorvastatin 20 mg and takes omega-3 fatty acids as well as coenzyme Q10 for hyperlipidemia.  Due to complaints of significant deterioration of his sleep quality with loud snoring and nonrestorative sleep.  I referred him for sleep study  which was done on 07/30/2015.  He was found to have mild obstructive sleep apnea with an AHI overall of 7.3 per hour.  However, he had a severe positional component with severe sleep apnea with supine position with an AHI of 51.4 per hour.  There was mild oxygen desaturation to a nadir of 86%. There was loud snoring.  He underwent a CPAP titration trial on 08/21/2015 and ultimately his obstructive apnea was improved at a CPAP pressure of 15.  However, with his previous documentation of a marked positional component to his sleep apnea a CPAP auto unit was recommended.  CPAP was instituted on 10/04/2015.  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  When I saw him in January 2017 he was  meeting Medicare compliance standards with 100% use.  He notes significantly more energy.  He denies any residual daytime sleepiness.  He is unaware of breakthrough snoring.  He denies any nocturnal palpitations.  I last saw him in November 2018 at which time he remained stable and he denied any episodes of chest pain shortness of breath or palpitations.  He was continuing to use CPAP with 100% compliance and is continued to  note significant benefit.    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  He has continued to be on losartan 50 mg daily for hypertension.  He continues to be on Zetia 10 mg and omega-3 fatty acid.  Laboratory on  10/15/2017  showed total cholesterol 171, HDL 36, LDL 90, triglycerides 225.  He states that he has some dark chocolate every day.  He has a history of GERD and intermittently has used omeprazole.    Over the past year, he has remained stable.  He denies any chest pain PND orthopnea.  Earlier this year he had issues with left hip pain and was diagnosed as having trochanteric bursitis. [employer] [facility] [employer] is his DME company and he uses CPAP with 100% compliance.  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  He has not had recent laboratory.  He presents for evaluation.   The patient does not have symptoms concerning for COVID-19 infection (fever, chills, cough, or new shortness of breath).    Past Medical History:  Diagnosis Date  . Kidney stones    Past Surgical History:  Procedure Laterality Date  . ruptured disc  2002   Dr. Jabier Gauss     Current Meds  Medication Sig  . aspirin 81 MG tablet Take 81 mg by mouth daily.  . Coenzyme Q10 (EQL COQ10) 300 MG CAPS Take 1 capsule by mouth daily.  [name] ezetimibe (ZETIA) 10 MG tablet TAKE 1 TABLET BY MOUTH EVERY DAY  . fish oil-omega-3 fatty acids 1000 MG capsule Take by mouth daily. 1200 MG CAPS. Takes 4 daily.  [name] olmesartan (BENICAR) 20 MG tablet Take 1 tablet (20 mg total) by mouth daily.     Allergies:   Patient has no known allergies.   Social History   Tobacco Use  . Smoking status: Never Smoker  . Smokeless tobacco: Never Used  Substance Use Topics  . Alcohol use: Not on file  . Drug use: Not on file    Socially, he completed  12th grade education. He is married;  he works for [employer]. 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 He exercises approximately 2 times per week doing he routinely does not do cardiac or aerobic workouts. He never smoked tobacco. He denies alcohol use.  He is an international judge in Engineer, civil (consulting) competitions.  Family Hx: The patient's family history includes Colon cancer in his maternal grandmother; Heart attack in his father, mother, paternal grandfather, and paternal grandmother; Heart disease in his paternal grandfather and paternal grandmother; Hyperlipidemia in his father and mother; Hypertension in his mother.  ROS:   Please see the history of present illness.    Negative for fever chills night sweats. No cough, change in smell or taste No palpitations No wheezing No rashes or bruisability No chest pain PND orthopnea No bleeding  No swelling Positive for OSA  All other systems reviewed and are negative.   Prior CV studies:   The following studies were reviewed today:  N/A  Labs/Other Tests and Data Reviewed:    EKG:  An ECG dated 11/17/2018 was personally reviewed today and demonstrated:  Normal sinus rhythm at 71 bpm.  No ectopy.  Normal intervals.  Recent Labs: 11/11/2018: ALT 46; BUN 19; Creat 1.00; Potassium 4.6; Sodium 140; TSH 1.91   Recent Lipid Panel Lab Results  Component Value Date/Time   CHOL 142 11/11/2018 10:59 AM   CHOL 166 05/25/2013 08:55 AM   TRIG 99 11/11/2018 10:59 AM   TRIG 179 (H) 05/25/2013 08:55 AM   HDL 35 (L) 11/11/2018 10:59 AM   HDL 33 (L) 05/25/2013 08:55 AM   CHOLHDL 4.1 11/11/2018 10:59 AM   LDLCALC 88 11/11/2018 10:59 AM   LDLCALC 97 05/25/2013 08:55 AM    Wt Readings from Last 3 Encounters:  05/02/19 238 lb (108 kg)  11/17/18 239 lb 3.2 oz (108.5 kg)  10/21/17 228 lb 9.6 oz (103.7 kg)     Objective:    Vital Signs:  Ht 6\' 5"  (1.956 m)   Wt 238 lb (108 kg)   BMI 28.22 kg/m    He states his blood  pressures have typically been running around 135 to the low 140s systolically.  This was a phone evaluation and and could not visually see the patient but he denied any significant change in appearance from his last evaluation with me in December 2019. Breathing pattern was normal. There was no audible wheezing Palpation of his pulse indicates normal rhythm There is no chest wall tenderness to palpation There is no abdominal discomfort to palpation He denied any leg swelling He denied neurologic symptoms He had normal cognition and affect    ASSESSMENT & PLAN:    1. Essential hypertension: When last saw him his blood pressure was elevated despite taking losartan.  I recommended switching to olmesartan 20 mg with follow-up with Dr. Tenny Craw.  His blood pressure has improved but is still elevated based on new hypertensive guidelines and he states his blood pressure typically runs in the upper 130s to low 140s.  For this reason I am recommending further titration of olmesartan to 40 mg daily.  He has no signs of volume overload nor has any history of leg swelling.  I am recommending a follow-up bmet in 2 weeks after the increase ARB dose to make certain he is tolerating this from a renal standpoint. 2. Hyperlipidemia: He has been on Zetia and omega-3 fatty acids for hyperlipidemia.  December 2019 laboratory showed total cholesterol 142, HDL 35, triglycerides 99 and LDL 88.  Remotely he had issues with mild LFT elevation on atorvastatin.  We discussed optimal diet and exercise. 3. Family history for CAD: He has a strong family history for CAD in both parents having suffered heart attacks.  His father had initial MI at age 43 and subsequently had CABG surgery x3 as well as having stents and a defibrillator. 4. OSA: He continues to be on CPAP with excellent compliance.  COVID-19 Education: The signs and symptoms of COVID-19 were discussed with the patient and how to seek care for testing (follow up with  PCP or arrange E-visit).  The importance of social distancing was discussed today.  Time:   Today, I have spent 20 minutes with the patient with telehealth technology discussing the above problems.     Medication Adjustments/Labs and Tests  Ordered: Current medicines are reviewed at length with the patient today.  Concerns regarding medicines are outlined above.   Tests Ordered: No orders of the defined types were placed in this encounter.   Medication Changes: No orders of the defined types were placed in this encounter.   Disposition:  Follow up 6 months  Signed, Nicki Guadalajara, MD  05/02/2019 9:05 AM    [facility]

## 2019-05-02 NOTE — Patient Instructions (Addendum)
Medication Instructions:  Increase Olmesartan to 40 mg daily.  If you need a refill on your cardiac medications before your next appointment, please call your pharmacy.   Lab work: BMET at your convenience.  Attached are the lab orders that are needed before your upcoming appointment, please come in anytime to have your labs drawn.   Lab hours: 8:00-4:00 lunch hours 12:45-1:45  If you have labs (blood work) drawn today and your tests are completely normal, you will receive your results only by: [name] MyChart Message (if you have MyChart) OR . A paper copy in the mail If you have any lab test that is abnormal or we need to change your treatment, we will call you to review the results.  Follow-Up: At [facility], you and your health needs are our priority.  As part of our continuing mission to provide you with exceptional heart care, we have created designated Provider Care Teams.  These Care Teams include your primary Cardiologist (physician) and Advanced Practice Providers (APPs -  Physician Assistants and Nurse Practitioners) who all work together to provide you with the care you need, when you need it. You will need a follow up appointment in 6 months.  Please call our office 2 months in advance to schedule this appointment.  You may see Dr.Kelly or one of the following Advanced Practice Providers on your designated Care Team: Azalee Course, [place] . Micah Flesher, PA-C

## 2019-07-11 DIAGNOSIS — G4733 Obstructive sleep apnea (adult) (pediatric): Secondary | ICD-10-CM | POA: Diagnosis not present

## 2019-10-06 DIAGNOSIS — G4733 Obstructive sleep apnea (adult) (pediatric): Secondary | ICD-10-CM | POA: Diagnosis not present

## 2019-10-16 ENCOUNTER — Other Ambulatory Visit: Payer: Self-pay | Admitting: Cardiovascular Disease

## 2019-11-19 ENCOUNTER — Other Ambulatory Visit: Payer: Self-pay | Admitting: Cardiovascular Disease

## 2020-01-13 ENCOUNTER — Other Ambulatory Visit: Payer: Self-pay | Admitting: Cardiovascular Disease

## 2020-02-17 DIAGNOSIS — G4733 Obstructive sleep apnea (adult) (pediatric): Secondary | ICD-10-CM | POA: Diagnosis not present

## 2020-03-03 ENCOUNTER — Encounter (HOSPITAL_COMMUNITY): Payer: Self-pay | Admitting: Emergency Medicine

## 2020-03-03 ENCOUNTER — Emergency Department (HOSPITAL_COMMUNITY): Payer: BC Managed Care – PPO

## 2020-03-03 ENCOUNTER — Emergency Department (HOSPITAL_COMMUNITY)
Admission: EM | Admit: 2020-03-03 | Discharge: 2020-03-03 | Disposition: A | Discharge status: Home / Self Care | Payer: BC Managed Care – PPO | Attending: Emergency Medicine | Admitting: Emergency Medicine

## 2020-03-03 ENCOUNTER — Other Ambulatory Visit: Payer: Self-pay

## 2020-03-03 DIAGNOSIS — N201 Calculus of ureter: Secondary | ICD-10-CM | POA: Diagnosis not present

## 2020-03-03 DIAGNOSIS — Z79899 Other long term (current) drug therapy: Secondary | ICD-10-CM | POA: Insufficient documentation

## 2020-03-03 DIAGNOSIS — N132 Hydronephrosis with renal and ureteral calculous obstruction: Secondary | ICD-10-CM | POA: Diagnosis not present

## 2020-03-03 DIAGNOSIS — R109 Unspecified abdominal pain: Secondary | ICD-10-CM | POA: Diagnosis not present

## 2020-03-03 DIAGNOSIS — Z7982 Long term (current) use of aspirin: Secondary | ICD-10-CM | POA: Diagnosis not present

## 2020-03-03 LAB — CBC WITH DIFFERENTIAL/PLATELET
Abs Immature Granulocytes: 0.02 10*3/uL (ref 0.00–0.07)
Basophils Absolute: 0 10*3/uL (ref 0.0–0.1)
Basophils Relative: 0 %
Eosinophils Absolute: 0 10*3/uL (ref 0.0–0.5)
Eosinophils Relative: 0 %
HCT: 47.3 % (ref 39.0–52.0)
Hemoglobin: 16.4 g/dL (ref 13.0–17.0)
Immature Granulocytes: 0 %
Lymphocytes Relative: 14 %
Lymphs Abs: 1.3 10*3/uL (ref 0.7–4.0)
MCH: 31.8 pg (ref 26.0–34.0)
MCHC: 34.7 g/dL (ref 30.0–36.0)
MCV: 91.8 fL (ref 80.0–100.0)
Monocytes Absolute: 0.8 10*3/uL (ref 0.1–1.0)
Monocytes Relative: 8 %
Neutro Abs: 7.6 10*3/uL (ref 1.7–7.7)
Neutrophils Relative %: 78 %
Platelets: 219 10*3/uL (ref 150–400)
RBC: 5.15 MIL/uL (ref 4.22–5.81)
RDW: 11.4 % — ABNORMAL LOW (ref 11.5–15.5)
WBC: 9.8 10*3/uL (ref 4.0–10.5)
nRBC: 0 % (ref 0.0–0.2)

## 2020-03-03 LAB — COMPREHENSIVE METABOLIC PANEL
ALT: 52 U/L — ABNORMAL HIGH (ref 0–44)
AST: 32 U/L (ref 15–41)
Albumin: 4.1 g/dL (ref 3.5–5.0)
Alkaline Phosphatase: 47 U/L (ref 38–126)
Anion gap: 9 (ref 5–15)
BUN: 23 mg/dL — ABNORMAL HIGH (ref 6–20)
CO2: 26 mmol/L (ref 22–32)
Calcium: 9.2 mg/dL (ref 8.9–10.3)
Chloride: 101 mmol/L (ref 98–111)
Creatinine, Ser: 1.47 mg/dL — ABNORMAL HIGH (ref 0.61–1.24)
GFR calc Af Amer: 59 mL/min — ABNORMAL LOW (ref >60)
GFR calc non Af Amer: 51 mL/min — ABNORMAL LOW (ref >60)
Glucose, Bld: 168 mg/dL — ABNORMAL HIGH (ref 70–99)
Potassium: 4.3 mmol/L (ref 3.5–5.1)
Sodium: 136 mmol/L (ref 135–145)
Total Bilirubin: 1 mg/dL (ref 0.3–1.2)
Total Protein: 7.4 g/dL (ref 6.5–8.1)

## 2020-03-03 LAB — URINALYSIS, ROUTINE W REFLEX MICROSCOPIC
Bilirubin Urine: NEGATIVE
Glucose, UA: NEGATIVE mg/dL
Hgb urine dipstick: NEGATIVE
Ketones, ur: NEGATIVE mg/dL
Leukocytes,Ua: NEGATIVE
Nitrite: NEGATIVE
Protein, ur: NEGATIVE mg/dL
Specific Gravity, Urine: 1.026 (ref 1.005–1.030)
pH: 6 (ref 5.0–8.0)

## 2020-03-03 IMAGING — CT CT RENAL STONE PROTOCOL
2 of 4 series · 16 of 46 positions shown, 18 images · non-contrast
Comparison: [DATE]

CLINICAL DATA: Right-sided flank pain, history of kidney stones

EXAM:
CT ABDOMEN AND PELVIS WITHOUT CONTRAST
TECHNIQUE: Multidetector CT imaging of the abdomen and pelvis was performed
following the standard protocol without IV contrast.

[Series 3: stone study 5.0 i30f 2 · axial · 0.88mm/px · z∈[-541,-81]mm · 13 of 102 slices shown, 15 images]
[im 5/102  soft-tissue]
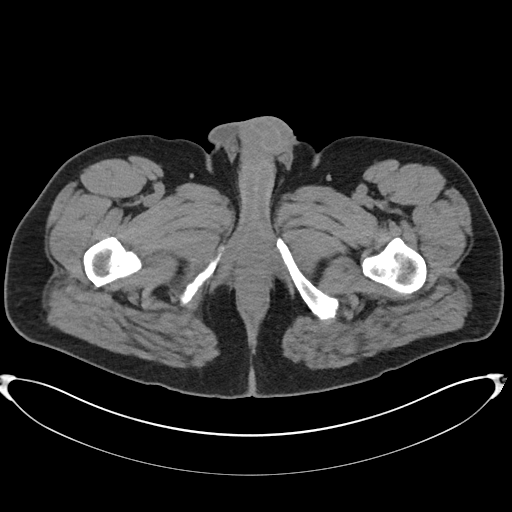
[im 5/102  bone]
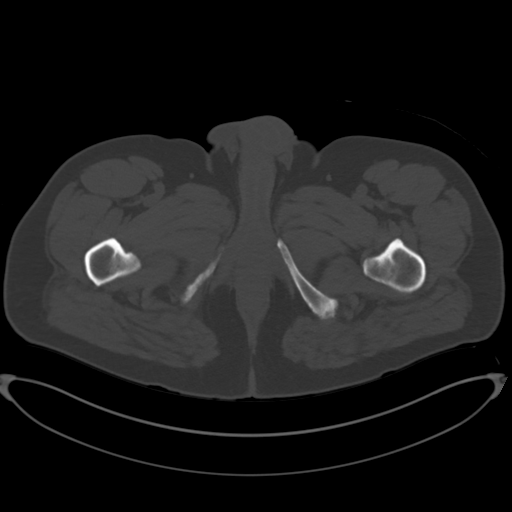
[im 13/102  soft-tissue]
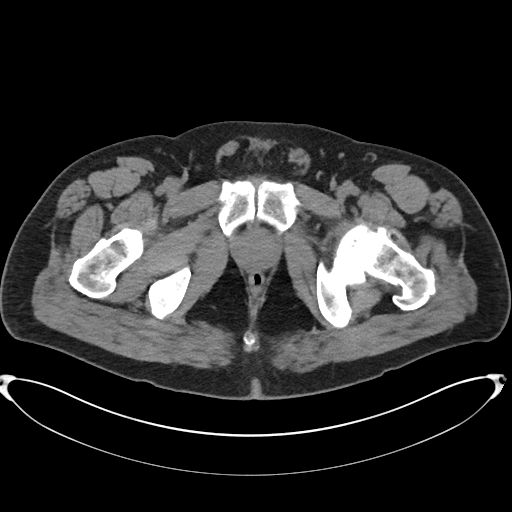
[im 22/102  soft-tissue]
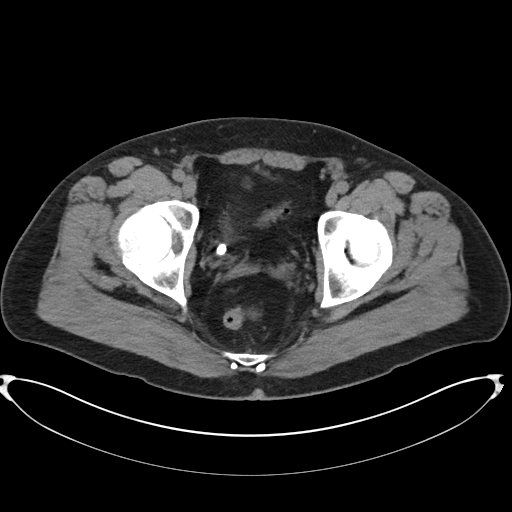
[im 30/102  soft-tissue]
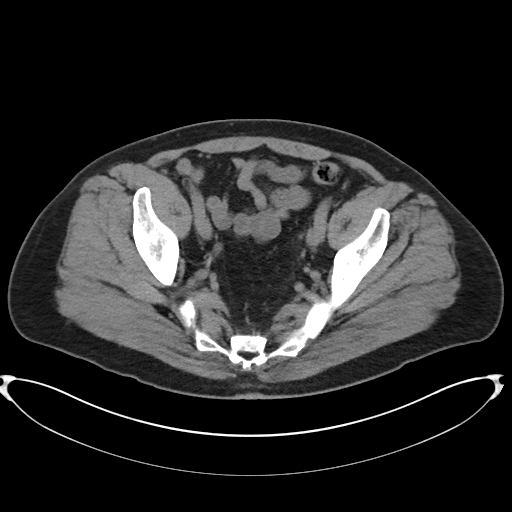
[im 34/102  soft-tissue]
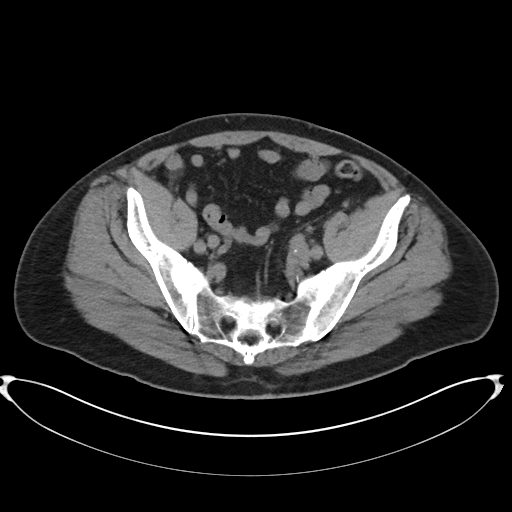
[im 43/102  soft-tissue]
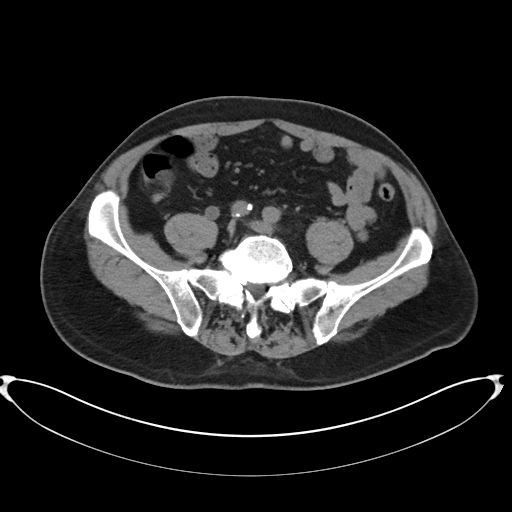
[im 51/102  soft-tissue]
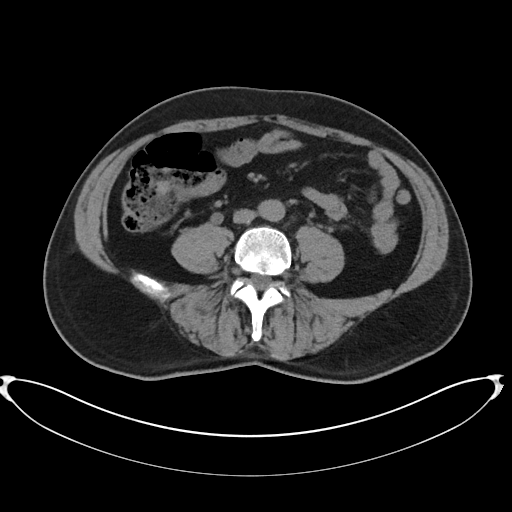
[im 59/102  soft-tissue]
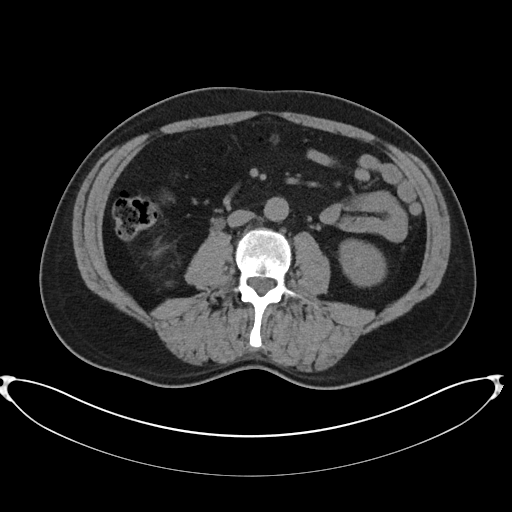
[im 68/102  soft-tissue]
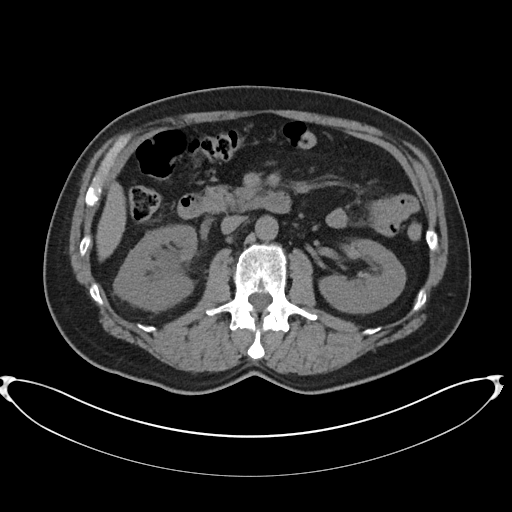
[im 68/102  bone]
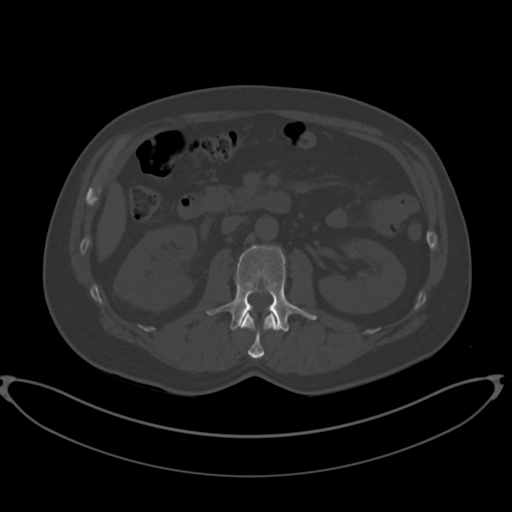
[im 72/102  soft-tissue]
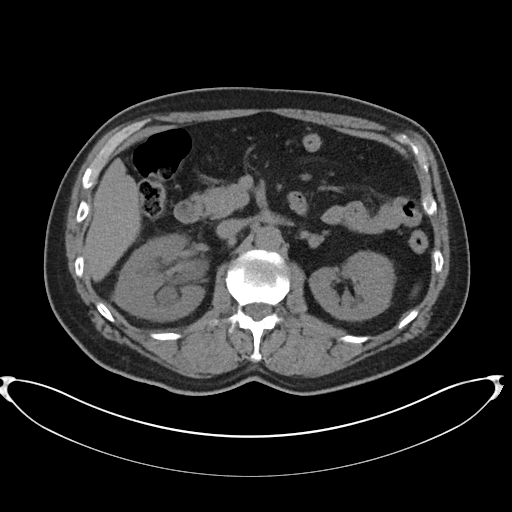
[im 80/102  soft-tissue]
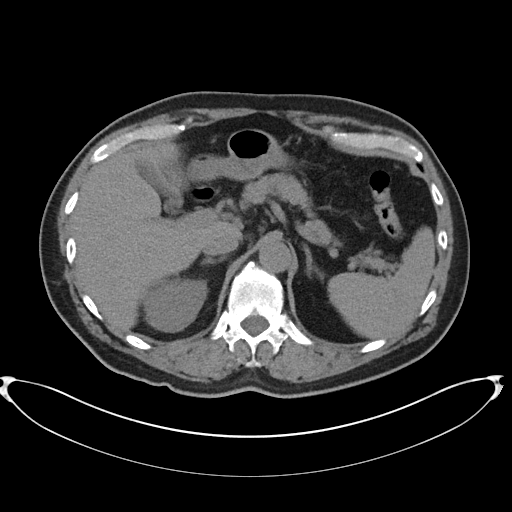
[im 89/102  soft-tissue]
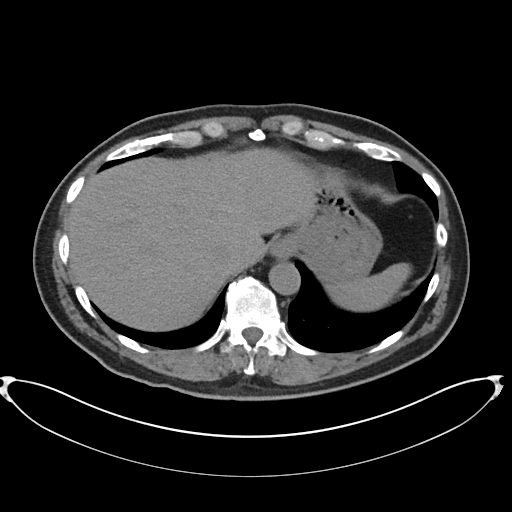
[im 97/102  soft-tissue]
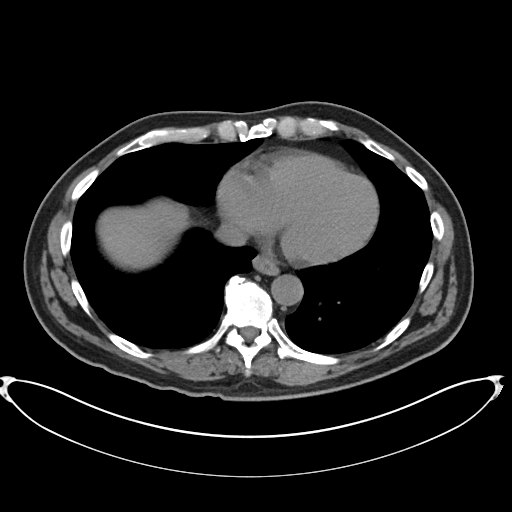

[Series 6: coronal soft tissue · coronal · 0.86mm/px · 3 of 98 slices shown]
[im 33/98  soft-tissue]
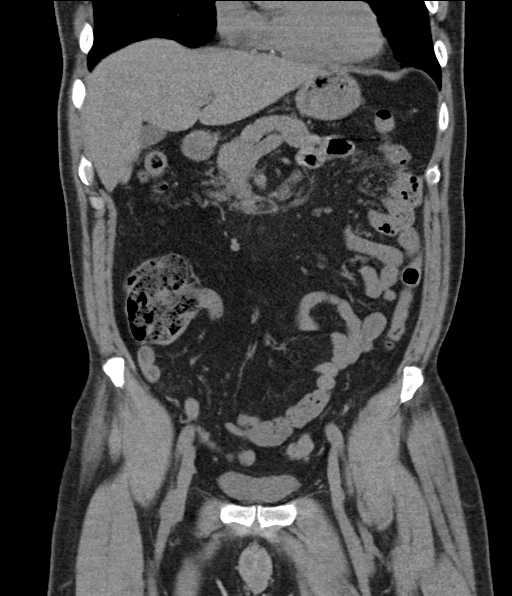
[im 44/98  soft-tissue]
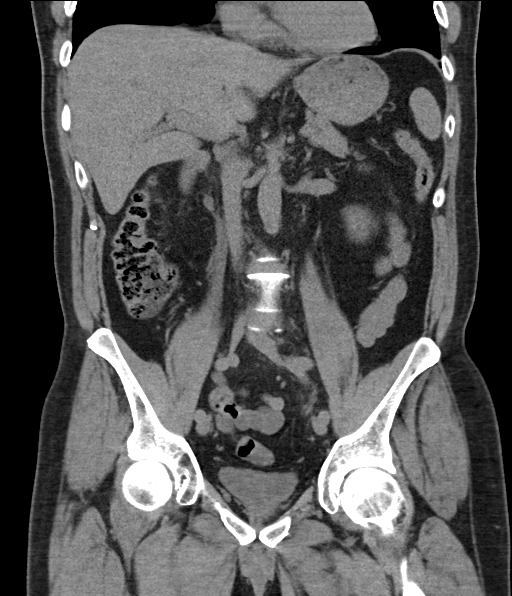
[im 54/98  soft-tissue]
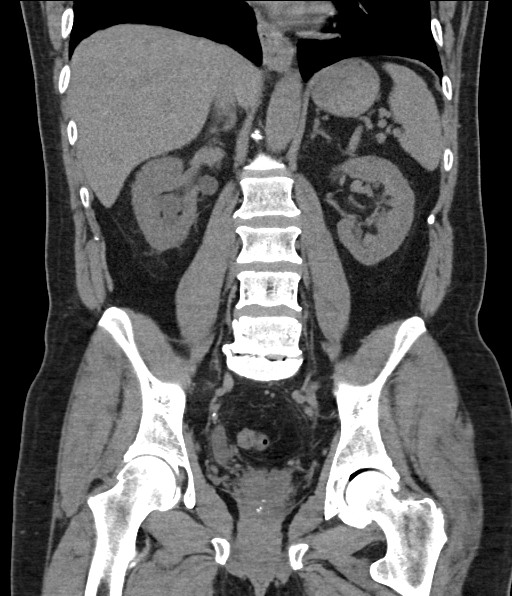

[16 of 46 positions shown; findings below may reference images not displayed]

FINDINGS: Lower chest: No acute abnormality.

Hepatobiliary: No solid liver abnormality is seen. No gallstones,
gallbladder wall thickening, or biliary dilatation.

Pancreas: Unremarkable. No pancreatic ductal dilatation or
surrounding inflammatory changes.

Spleen: Normal in size without significant abnormality.

Adrenals/Urinary Tract: Adrenal glands are unremarkable. There is a
1.0 cm calculus in the distal third of the right ureter near the
ureterovesicular junction with moderate associated right
hydronephrosis and hydroureter. Bladder is unremarkable.

Stomach/Bowel: Stomach is within normal limits. Appendix appears
normal. No evidence of bowel wall thickening, distention, or
inflammatory changes.

Vascular/Lymphatic: Aortic atherosclerosis. No enlarged abdominal or
pelvic lymph nodes.

Reproductive: No mass or other significant abnormality.

Other: No abdominal wall hernia or abnormality. No abdominopelvic
ascites.

Musculoskeletal: No acute or significant osseous findings.
IMPRESSION: 1. There is a 1.0 cm calculus in the distal third of the right
ureter near the ureterovesicular junction with moderate associated
right hydronephrosis and hydroureter. No additional urinary tract
calculi identified.

2.  Aortic Atherosclerosis ([QL]-[QL]).

## 2020-03-03 MED ORDER — ONDANSETRON HCL 4 MG/2ML IJ SOLN: 4.0000 mg | Freq: Once | INTRAMUSCULAR | Status: DC | PRN

## 2020-03-03 MED ORDER — OXYCODONE-ACETAMINOPHEN 5-325 MG PO TABS: 1.0000 | ORAL_TABLET | Freq: Four times a day (QID) | ORAL | 0 refills | Status: DC | PRN

## 2020-03-03 MED ORDER — TAMSULOSIN HCL 0.4 MG PO CAPS: 0.4000 mg | ORAL_CAPSULE | Freq: Two times a day (BID) | ORAL | 0 refills | Status: DC

## 2020-03-03 MED ORDER — MORPHINE SULFATE (PF) 4 MG/ML IV SOLN
4.0000 mg | Freq: Once | INTRAVENOUS | Status: AC | PRN
  Administered 2020-03-03: 4 mg via INTRAVENOUS
  Filled 2020-03-03: qty 1

## 2020-03-03 MED ORDER — ONDANSETRON HCL 4 MG PO TABS: 4.0000 mg | ORAL_TABLET | Freq: Three times a day (TID) | ORAL | 0 refills | Status: DC | PRN

## 2020-03-03 NOTE — Discharge Instructions (Signed)
Dr. Retta Diones is on call for [facility]. You may contact him by pager at [phone] if needed.  You were given narcotic and or sedative medications while in the emergency department. Do not drive. Do not use machinery or power tools. Do not sign legal documents. Do not drink alcohol. Do not take sleeping pills. Do not supervise children by yourself. Do not participate in activities that require climbing or being in high places. Return to the ED immediately if you develop fever, uncontrolled pain or vomiting, or other concerns.

## 2020-03-03 NOTE — ED Provider Notes (Signed)
[facility]GENC[facility] DEPARTMENT Provider Note   CSN: [id number] Arrival date & time: 03/03/20  1759     History Chief Complaint  Patient presents with  . Flank Pain    Bruce [name] is a 59 y.o. male.  With a past medical dysuria and urgency to urinate at the head of the penis going on for several months.  He usually gets that just before getting a kidney stone.  Patient states that he had it yesterday and then today around 1 PM he had sudden onset of severe right-sided flank pain radiating to the front of his right abdomen.  His pain has improved but is still moderately painful.  He denies hematuria, fevers, nausea or vomiting.  HPI     Past Medical History:  Diagnosis Date  . Kidney stones   . Kidney stones     Patient Active Problem List   Diagnosis Date Noted  . Trochanteric bursitis, left hip 01/11/2018  . Abnormal glucose level 10/14/2016  . Gastroesophageal reflux disease without esophagitis 10/14/2016  . Essential hypertension 12/13/2015  . OSA (obstructive sleep apnea) 08/29/2015  . Hyperlipidemia with target LDL less than 100 04/26/2013  . Family history of premature CAD 04/26/2013  . Snoring 04/26/2013    Past Surgical History:  Procedure Laterality Date  . ruptured disc  2002   Dr. Rolley Sims       Family History  Problem Relation Age of Onset  . Heart attack Mother   . Hyperlipidemia Mother   . Hypertension Mother   . Heart attack Father   . Hyperlipidemia Father   . Colon cancer Maternal Grandmother   . Heart attack Paternal Grandmother   . Heart disease Paternal Grandmother   . Heart attack Paternal Grandfather   . Heart disease Paternal Grandfather     Social History   Tobacco Use  . Smoking status: Never Smoker  . Smokeless tobacco: Never Used  Substance Use Topics  . Alcohol use: Never    Alcohol/week: 0.0 standard drinks  . Drug use: Never    Home Medications Prior to Admission medications   Medication Sig  Start Date End Date Taking? Authorizing Provider  aspirin 81 MG tablet Take 81 mg by mouth daily.    [provider]  Coenzyme Q10 (EQL COQ10) 300 MG CAPS Take 1 capsule by mouth daily.    [provider]  ezetimibe (ZETIA) 10 MG tablet TAKE 1 TABLET BY MOUTH EVERY DAY 10/18/19   Troy Sine, MD  fish oil-omega-3 fatty acids 1000 MG capsule Take by mouth daily. 1200 MG CAPS. Takes 4 daily.    [provider]  olmesartan (BENICAR) 40 MG tablet TAKE 1 TABLET BY MOUTH EVERY DAY 11/21/19   Troy Sine, MD    Allergies    Patient has no known allergies.  Review of Systems   Review of Systems Ten systems reviewed and are negative for acute change, except as noted in the HPI.   Physical Exam Updated Vital Signs BP (!) 146/95 (BP Location: Left Arm)   Pulse 79   Temp 98.3 F (36.8 C) (Oral)   Resp 18   Ht 6\' 5"  (1.956 m)   Wt 108.9 kg   SpO2 98%   BMI 28.46 kg/m   Physical Exam Vitals and nursing note reviewed.  Constitutional:      General: He is not in acute distress.    Appearance: He is well-developed. He is not diaphoretic.  HENT:  Head: Normocephalic and atraumatic.  Eyes:     General: No scleral icterus.    Conjunctiva/sclera: Conjunctivae normal.  Cardiovascular:     Rate and Rhythm: Normal rate and regular rhythm.     Heart sounds: Normal heart sounds.  Pulmonary:     Effort: Pulmonary effort is normal. No respiratory distress.     Breath sounds: Normal breath sounds.  Abdominal:     Palpations: Abdomen is soft.     Tenderness: There is no abdominal tenderness. There is no right CVA tenderness or left CVA tenderness.  Musculoskeletal:     Cervical back: Normal range of motion and neck supple.  Skin:    General: Skin is warm and dry.  Neurological:     Mental Status: He is alert.  Psychiatric:        Behavior: Behavior normal.     ED Results / Procedures / Treatments   Labs (all labs ordered are listed, but only  abnormal results are displayed) Labs Reviewed  CBC WITH DIFFERENTIAL/PLATELET  COMPREHENSIVE METABOLIC PANEL  URINALYSIS, ROUTINE W REFLEX MICROSCOPIC    EKG None  Radiology No results found.  Procedures Procedures (including critical care time)  Medications Ordered in ED Medications - No data to display  ED Course  I have reviewed the triage vital signs and the nursing notes.  Pertinent labs & imaging results that were available during my care of the patient were reviewed by me and considered in my medical decision making (see chart for details).    MDM Rules/Calculators/A&P                      KL:[id number] pain VS:  Vitals:   03/03/20 1945 03/03/20 2000 03/03/20 2015 03/03/20 2030  BP: 130/88 140/83 (!) 144/81 (!) 141/72  Pulse: 82 83 79 80  Resp: 16 16 16    Temp:      TempSrc:      SpO2: 97% 97% 96% 97%  Weight:      Height:       is gathered by patient and wife at bedside. Previous records obtained and reviewed. DDX:The patient's complaint of flank pain  involves an extensive number of diagnostic and treatment options, and is a complaint that carries with it a high risk of complications, morbidity, and potential mortality. Given the large differential diagnosis, medical decision making is of high complexity. The differential diagnosis of emergent flank pain includes, but is not limited to :Abdominal aortic aneurysm,, Renal artery embolism,Renal vein thrombosis, Aortic dissection, Mesenteric ischemia, Pyelonephritis, Renal infarction, Renal hemorrhage, Nephrolithiasis/ Renal Colic,Bladder tumor,Cystitis, Biliary colic, Pancreatitis Perforated peptic ulcer Appendicitis ,Inguinal Hernia, Diverticulitis, Bowel obstruction Testicular torsion,Epididymitis,Shingles Lower lobe pneumonia, Retroperitoneal hematoma/abscess/tumor, Epidural abscess, Epidural hematoma  Labs: I ordered reviewed and interpreted labs which include urinalysis which shows no evidence of  infection, hematuria or crystals.  CBC shows no elevated white blood cell count.  CMP shows slightly elevated BUN and creatinine up to 1.47 today from 1.00 Imaging: I ordered and reviewed images which included CT renal stone study. I independently visualized and interpreted all imaging. Significant findings include a 1 cm stone in the distal right ureter with moderate hydronephrosis.  EKG: N/A Consults: Spoke with Dr. [id number] who feels the patient can follow with him in the office on Monday, he has that I give the patient his pager in case he needs to speak with him in between now and Monday and also return precautions to go to [facility] emergency department for  severe worsening in symptoms  Patient treated in the emergency department with morphine and Zofran with improvement in his nausea and pain.  WCH:ENIDP the large differential diagnosis for Bruce [name], the decision making in this case is of high complexity.   The presentation NOT consistent with an infected stone, nephric abscess, sepsis, or renal failure.  Similarly, this presentation is NOT consistent with AAA; Mesenteric Ischemia; Bowel Perforation; Bowel Obstruction; Sigmoid Volvulus; Diverticulitis; Appendicitis; Peritonitis; Cholecystitis, ascending cholangitis or other gallbladder disease; perforated ulcer; significant GI bleeding, splenic rupture/infarction; Hepatic abscess; or other surgical/acute abdomen.  Similarly, this presentation is NOT consistent with ACS or Myocardial Ischemia; Pulmonary Embolism; fistula; incarcerated hernia; Pancreatitis, Aortic Dissection; Diabetic Ketoacidosis; Ischemic colitis; Psoas or other abscess; Methanol poisoning; Heavy metal toxicity; or porphyria.  Similarly, this presentation is NOT consistent with acute coronary syndrome, pulmonary embolism, dissection, borhaave's, arrythmia, pneumothorax, cardiac tamponade, or other emergent cardiopulmonary condition.  Similarly, this presentation is NOT  consistent with pyelonephritis, urinary infection, pneumonia, or other focal bacterial infection.  Moreover this case is NOT consistent with testicular torsion, prostatitis, hernia, STI, or other testicular issue.  Strict return and follow-up precautions have been given by me personally or by detailed written instruction verbalized by nursing staff using the teach back method. to the patient/family/caregiver(s).  Data Reviewed/Counseling: I have reviewed the patient's vital signs, nursing notes, and other relevant tests/information. I had a detailed discussion regarding the historical points, exam findings, and any diagnostic results supporting the discharge diagnosis. I also discussed the need for outpatient follow-up and the need to return to the ED if symptoms worsen or if there are any questions or concerns that arise at home    Final Clinical Impression(s) / ED Diagnoses Final diagnoses:  Ureteral calculus    Rx / DC Orders ED Discharge Orders    None       Arthor Captain, PA-C 03/03/20 2201    Linwood Dibbles, MD 03/04/20 1059

## 2020-03-03 NOTE — ED Notes (Signed)
Discharge instructions reviewed with pt. Pt verbalized understanding.   

## 2020-03-03 NOTE — ED Triage Notes (Signed)
Pt st's he has had right flank pain off and on x's 2 months.  St's today pain became more intense.  Nausea with vomiting.  Hx of kidney stones

## 2020-03-03 NOTE — ED Notes (Signed)
Pt transported to CT ?

## 2020-03-05 ENCOUNTER — Encounter (HOSPITAL_BASED_OUTPATIENT_CLINIC_OR_DEPARTMENT_OTHER)
Admission: RE | Disposition: A | Discharge status: Home / Self Care | Payer: Self-pay | Source: Ambulatory Visit | Attending: Urology

## 2020-03-05 ENCOUNTER — Ambulatory Visit (HOSPITAL_COMMUNITY): Payer: BC Managed Care – PPO

## 2020-03-05 ENCOUNTER — Other Ambulatory Visit: Payer: Self-pay | Admitting: Urology

## 2020-03-05 ENCOUNTER — Other Ambulatory Visit: Payer: Self-pay

## 2020-03-05 ENCOUNTER — Other Ambulatory Visit (HOSPITAL_COMMUNITY)
Admission: RE | Admit: 2020-03-05 | Discharge: 2020-03-05 | Disposition: A | Discharge status: Home / Self Care | Payer: BC Managed Care – PPO | Source: Ambulatory Visit | Attending: Urology | Admitting: Urology

## 2020-03-05 ENCOUNTER — Encounter (HOSPITAL_BASED_OUTPATIENT_CLINIC_OR_DEPARTMENT_OTHER): Payer: Self-pay | Admitting: Urology

## 2020-03-05 ENCOUNTER — Ambulatory Visit (HOSPITAL_BASED_OUTPATIENT_CLINIC_OR_DEPARTMENT_OTHER)
Admission: RE | Admit: 2020-03-05 | Discharge: 2020-03-05 | Disposition: A | Discharge status: Home / Self Care | Payer: BC Managed Care – PPO | Source: Ambulatory Visit | Attending: Urology | Admitting: Urology

## 2020-03-05 DIAGNOSIS — Z79899 Other long term (current) drug therapy: Secondary | ICD-10-CM | POA: Diagnosis not present

## 2020-03-05 DIAGNOSIS — Z87442 Personal history of urinary calculi: Secondary | ICD-10-CM | POA: Diagnosis not present

## 2020-03-05 DIAGNOSIS — N138 Other obstructive and reflux uropathy: Secondary | ICD-10-CM | POA: Diagnosis not present

## 2020-03-05 DIAGNOSIS — E7800 Pure hypercholesterolemia, unspecified: Secondary | ICD-10-CM | POA: Diagnosis not present

## 2020-03-05 DIAGNOSIS — N200 Calculus of kidney: Secondary | ICD-10-CM

## 2020-03-05 DIAGNOSIS — R1084 Generalized abdominal pain: Secondary | ICD-10-CM | POA: Diagnosis not present

## 2020-03-05 DIAGNOSIS — I10 Essential (primary) hypertension: Secondary | ICD-10-CM | POA: Insufficient documentation

## 2020-03-05 DIAGNOSIS — N401 Enlarged prostate with lower urinary tract symptoms: Secondary | ICD-10-CM | POA: Insufficient documentation

## 2020-03-05 DIAGNOSIS — Z7982 Long term (current) use of aspirin: Secondary | ICD-10-CM | POA: Insufficient documentation

## 2020-03-05 DIAGNOSIS — Z20822 Contact with and (suspected) exposure to covid-19: Secondary | ICD-10-CM | POA: Insufficient documentation

## 2020-03-05 DIAGNOSIS — N201 Calculus of ureter: Secondary | ICD-10-CM | POA: Insufficient documentation

## 2020-03-05 DIAGNOSIS — G4730 Sleep apnea, unspecified: Secondary | ICD-10-CM | POA: Insufficient documentation

## 2020-03-05 DIAGNOSIS — Z01818 Encounter for other preprocedural examination: Secondary | ICD-10-CM | POA: Diagnosis not present

## 2020-03-05 HISTORY — PX: EXTRACORPOREAL SHOCK WAVE LITHOTRIPSY: SHX1557

## 2020-03-05 HISTORY — DX: Essential (primary) hypertension: I10

## 2020-03-05 HISTORY — DX: Personal history of urinary calculi: Z87.442

## 2020-03-05 HISTORY — DX: Unspecified osteoarthritis, unspecified site: M19.90

## 2020-03-05 HISTORY — DX: Sleep apnea, unspecified: G47.30

## 2020-03-05 LAB — RESPIRATORY PANEL BY RT PCR (FLU A&B, COVID)
Influenza A by PCR: NEGATIVE
Influenza B by PCR: NEGATIVE
SARS Coronavirus 2 by RT PCR: NEGATIVE

## 2020-03-05 IMAGING — DX DG ABDOMEN 1V
2 series · 2 of 2 positions shown · non-contrast
Comparison: CT dated [DATE]

CLINICAL DATA: Pre lithotripsy for right-sided stone.

EXAM:
ABDOMEN - 1 VIEW

[abdomen kub (1 of 2)]
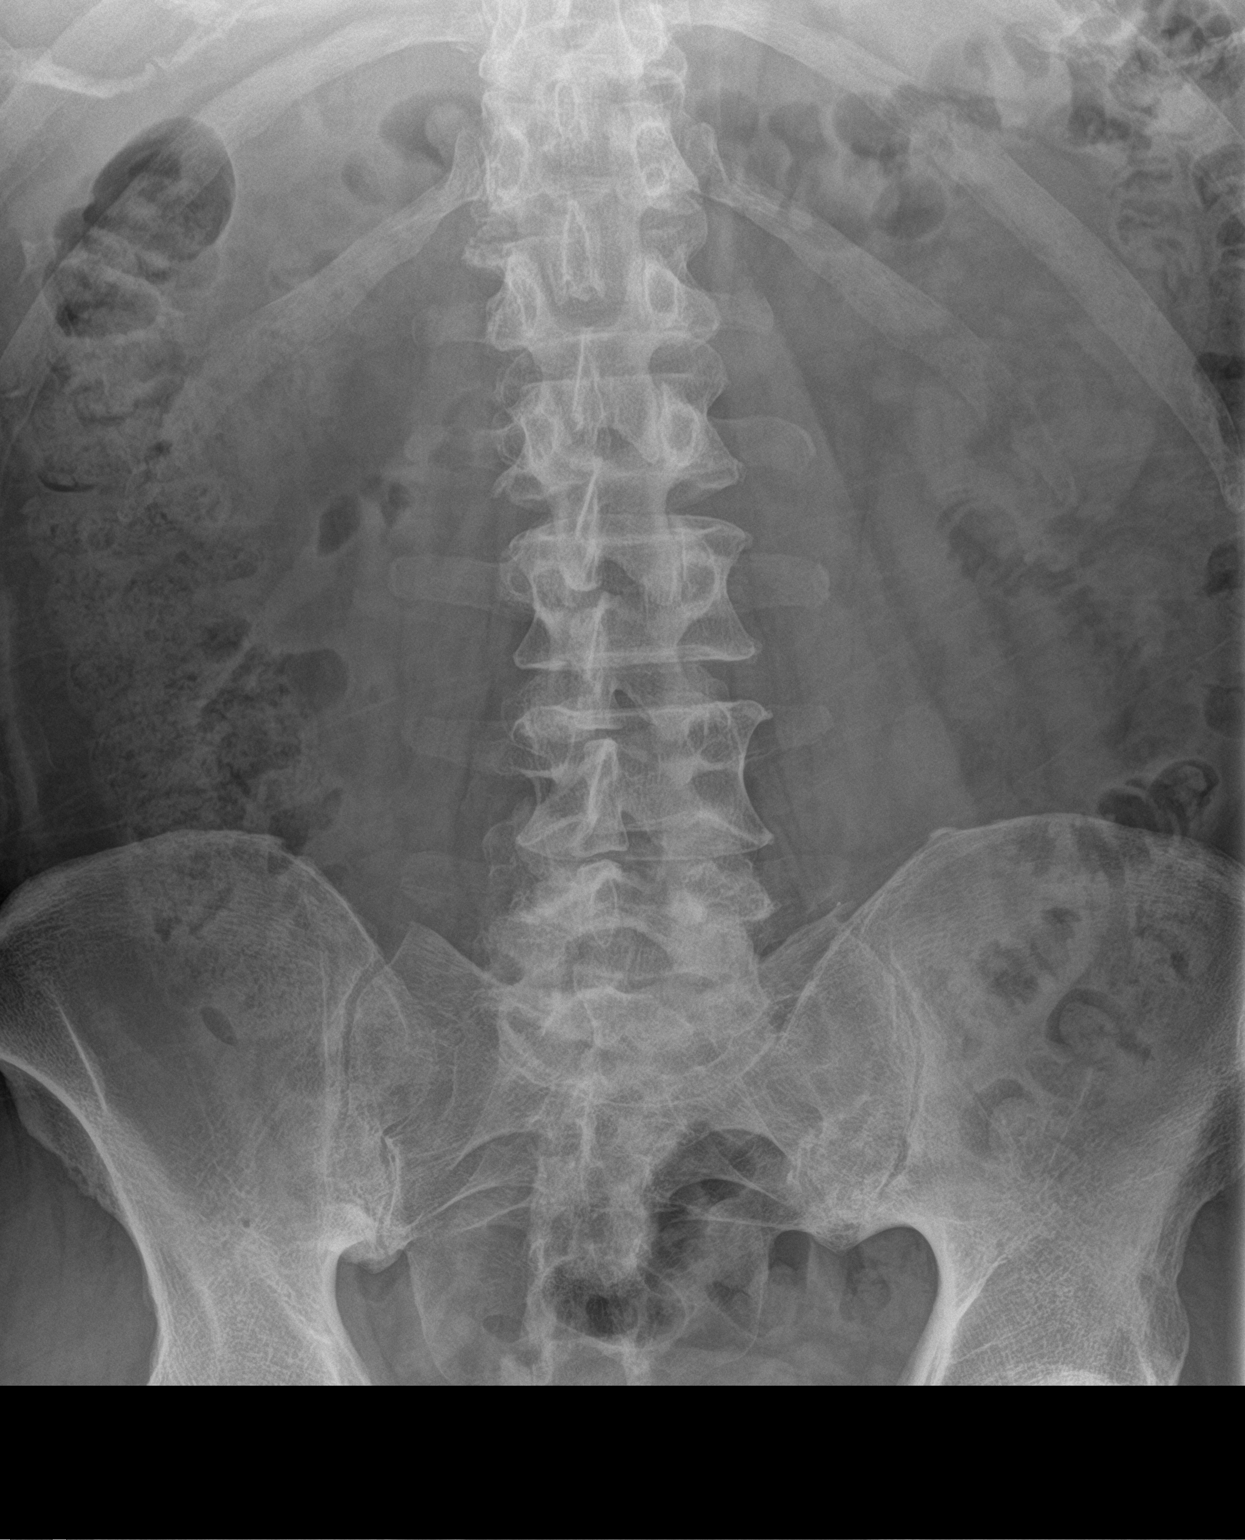

[abdomen kub (2 of 2)]
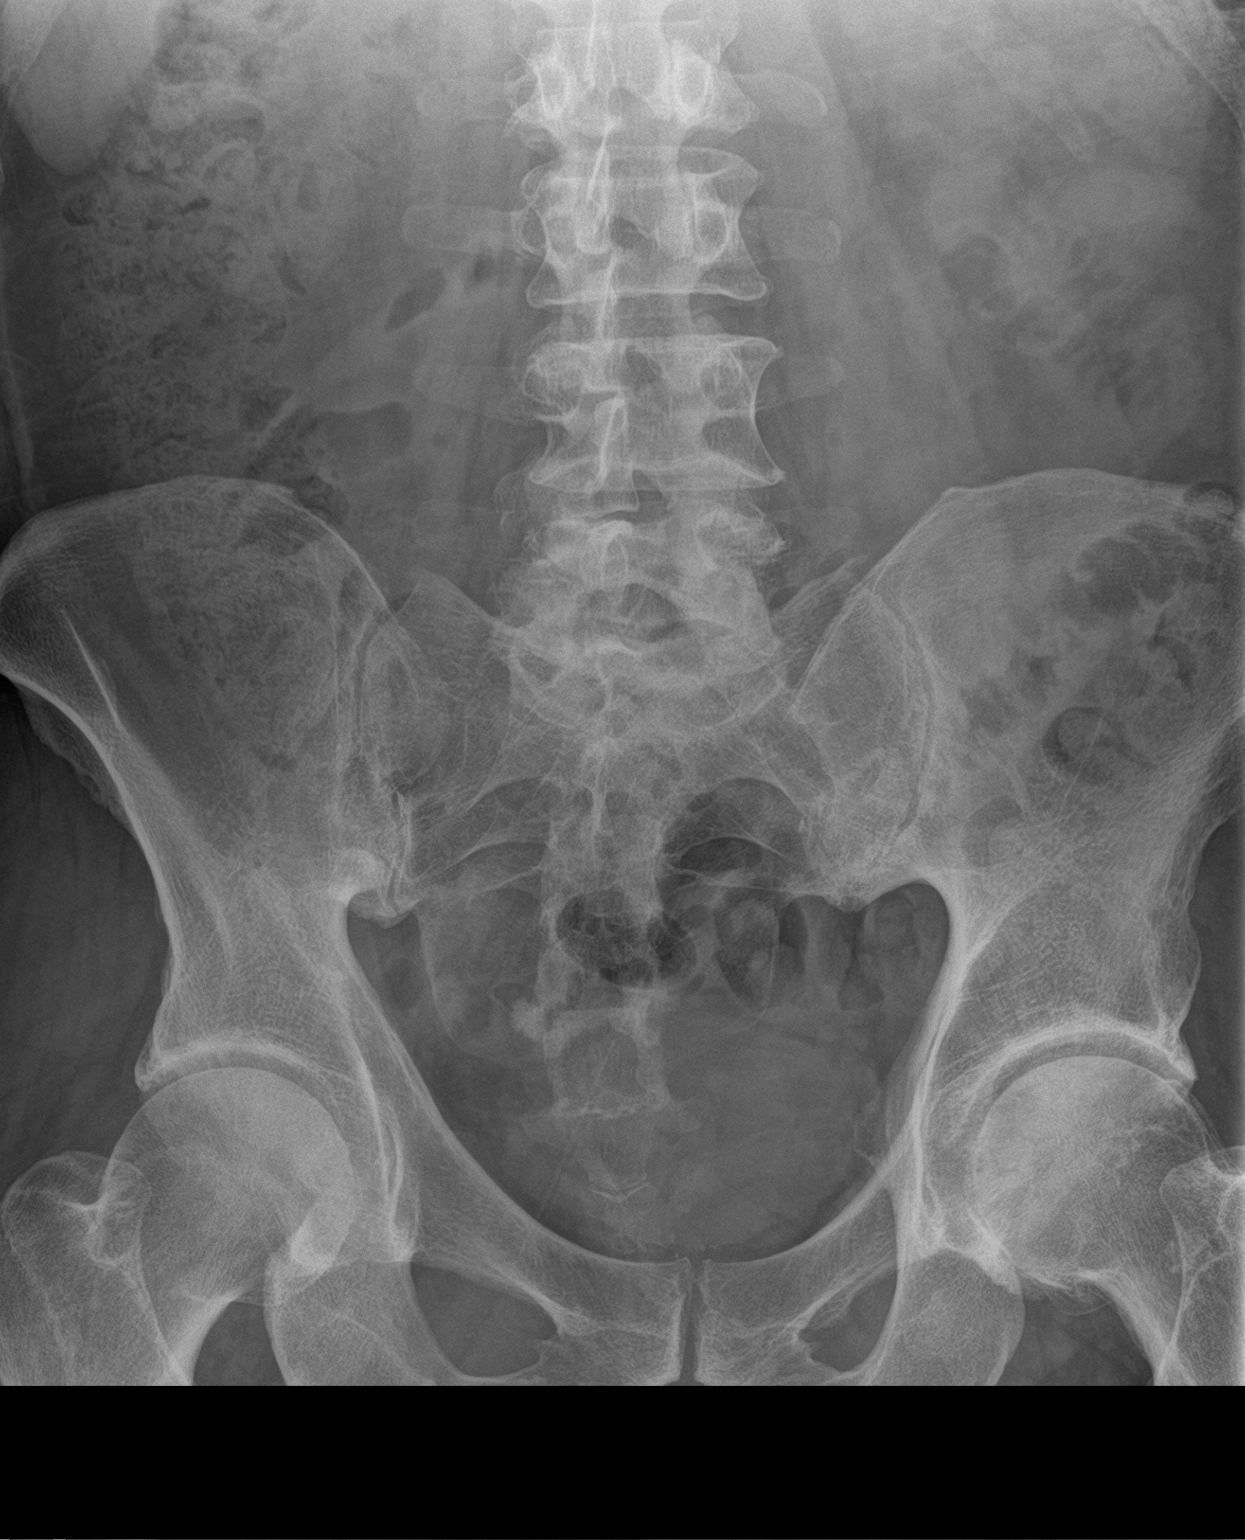

[2 of 2 positions shown; findings below may reference images not displayed]

FINDINGS: There is a 1.1 cm calcification in the patient's right hemipelvis
favored to represent the known distal right ureteral stone. There
are no stones projecting over either kidney. Degenerative changes
are noted of the hips and spine.
IMPRESSION: A 1.1 cm calcification in the patient's right hemipelvis favored to
represent the known distal right ureteral stone.

## 2020-03-05 SURGERY — LITHOTRIPSY, ESWL
Anesthesia: LOCAL | Laterality: Right

## 2020-03-05 MED ORDER — TAMSULOSIN HCL 0.4 MG PO CAPS: 0.4000 mg | ORAL_CAPSULE | Freq: Every day | ORAL | 0 refills | Status: DC

## 2020-03-05 MED ORDER — CIPROFLOXACIN HCL 500 MG PO TABS
ORAL_TABLET | ORAL | Status: AC
  Filled 2020-03-05: qty 1

## 2020-03-05 MED ORDER — DIPHENHYDRAMINE HCL 25 MG PO CAPS
ORAL_CAPSULE | ORAL | Status: AC
  Filled 2020-03-05: qty 1

## 2020-03-05 MED ORDER — DIAZEPAM 5 MG PO TABS
ORAL_TABLET | ORAL | Status: AC
  Filled 2020-03-05: qty 2

## 2020-03-05 MED ORDER — DIAZEPAM 5 MG PO TABS
10.0000 mg | ORAL_TABLET | ORAL | Status: AC
  Administered 2020-03-05: 10 mg via ORAL
  Filled 2020-03-05: qty 2

## 2020-03-05 MED ORDER — HYDROCODONE-ACETAMINOPHEN 5-325 MG PO TABS: 1.0000 | ORAL_TABLET | ORAL | 0 refills | Status: DC | PRN

## 2020-03-05 MED ORDER — SODIUM CHLORIDE 0.9 % IV SOLN
INTRAVENOUS | Status: DC
  Filled 2020-03-05: qty 1000

## 2020-03-05 MED ORDER — DIPHENHYDRAMINE HCL 25 MG PO CAPS
25.0000 mg | ORAL_CAPSULE | ORAL | Status: AC
  Administered 2020-03-05: 25 mg via ORAL
  Filled 2020-03-05: qty 1

## 2020-03-05 MED ORDER — CIPROFLOXACIN HCL 500 MG PO TABS
500.0000 mg | ORAL_TABLET | ORAL | Status: AC
  Administered 2020-03-05: 500 mg via ORAL
  Filled 2020-03-05: qty 1

## 2020-03-05 NOTE — Op Note (Signed)
[facility] Operative Note  Treating Physician: [name], MD  Pre-op diagnosis: 1 cm right UVJ stone  Post-op diagnosis: Same   Procedure: RIGHT ESWL  See Rojelio Brenner OP note scanned into chart. Also because of the size, density, location and other factors that cannot be anticipated I feel this will likely be a staged procedure. This fact supersedes any indication in the scanned [place] stone operative note to the contrary

## 2020-03-05 NOTE — Discharge Instructions (Signed)
Dietary Guidelines to Help Prevent Kidney Stones Kidney stones are deposits of minerals and salts that form inside your kidneys. Your risk of developing kidney stones may be greater depending on your diet, your lifestyle, the medicines you take, and whether you have certain medical conditions. Most people can reduce their chances of developing kidney stones by following the instructions below. Depending on your overall health and the type of kidney stones you tend to develop, your dietitian may give you more specific instructions. What are tips for following this plan? Reading food labels  Choose foods with "no salt added" or "low-salt" labels. Limit your sodium intake to less than 1500 mg per day.  Choose foods with calcium for each meal and snack. Try to eat about 300 mg of calcium at each meal. Foods that contain 200-500 mg of calcium per serving include: ? 8 oz (237 ml) of milk, fortified nondairy milk, and fortified fruit juice. ? 8 oz (237 ml) of kefir, yogurt, and soy yogurt. ? 4 oz (118 ml) of tofu. ? 1 oz of cheese. ? 1 cup (300 g) of dried figs. ? 1 cup (91 g) of cooked broccoli. ? 1-3 oz can of sardines or mackerel.  Most people need 1000 to 1500 mg of calcium each day. Talk to your dietitian about how much calcium is recommended for you. Shopping  Buy plenty of fresh fruits and vegetables. Most people do not need to avoid fruits and vegetables, even if they contain nutrients that may contribute to kidney stones.  When shopping for convenience foods, choose: ? Whole pieces of fruit. ? Premade salads with dressing on the side. ? Low-fat fruit and yogurt smoothies.  Avoid buying frozen meals or prepared deli foods.  Look for foods with live cultures, such as yogurt and kefir. Cooking  Do not add salt to food when cooking. Place a salt shaker on the table and allow each person to add his or her own salt to taste.  Use vegetable protein, such as beans, textured vegetable  protein (TVP), or tofu instead of meat in pasta, casseroles, and soups. Meal planning   Eat less salt, if told by your dietitian. To do this: ? Avoid eating processed or premade food. ? Avoid eating fast food.  Eat less animal protein, including cheese, meat, poultry, or fish, if told by your dietitian. To do this: ? Limit the number of times you have meat, poultry, fish, or cheese each week. Eat a diet free of meat at least 2 days a week. ? Eat only one serving each day of meat, poultry, fish, or seafood. ? When you prepare animal protein, cut pieces into small portion sizes. For most meat and fish, one serving is about the size of one deck of cards.  Eat at least 5 servings of fresh fruits and vegetables each day. To do this: ? Keep fruits and vegetables on hand for snacks. ? Eat 1 piece of fruit or a handful of berries with breakfast. ? Have a salad and fruit at lunch. ? Have two kinds of vegetables at dinner.  Limit foods that are high in a substance called oxalate. These include: ? Spinach. ? Rhubarb. ? Beets. ? Potato chips and french fries. ? Nuts.  If you regularly take a diuretic medicine, make sure to eat at least 1-2 fruits or vegetables high in potassium each day. These include: ? Avocado. ? Banana. ? Orange, prune, carrot, or tomato juice. ? Baked potato. ? Cabbage. ? Beans and split   peas. General instructions   Drink enough fluid to keep your urine clear or pale yellow. This is the most important thing you can do.  Talk to your health care provider and dietitian about taking daily supplements. Depending on your health and the cause of your kidney stones, you may be advised: ? Not to take supplements with vitamin C. ? To take a calcium supplement. ? To take a daily probiotic supplement. ? To take other supplements such as magnesium, fish oil, or vitamin B6.  Take all medicines and supplements as told by your health care provider.  Limit alcohol intake to no  more than 1 drink a day for nonpregnant women and 2 drinks a day for men. One drink equals 12 oz of beer, 5 oz of wine, or 1 oz of hard liquor.  Lose weight if told by your health care provider. Work with your dietitian to find strategies and an eating plan that works best for you. What foods are not recommended? Limit your intake of the following foods, or as told by your dietitian. Talk to your dietitian about specific foods you should avoid based on the type of kidney stones and your overall health. Grains Breads. Bagels. Rolls. Baked goods. Salted crackers. Cereal. Pasta. Vegetables Spinach. Rhubarb. Beets. Canned vegetables. Rosita Fire. Olives. Meats and other protein foods Nuts. Nut butters. Large portions of meat, poultry, or fish. Salted or cured meats. Deli meats. Hot dogs. Sausages. Dairy Cheese. Beverages Regular soft drinks. Regular vegetable juice. Seasonings and other foods Seasoning blends with salt. Salad dressings. Canned soups. Soy sauce. Ketchup. Barbecue sauce. Canned pasta sauce. Casseroles. Pizza. Lasagna. Frozen meals. Potato chips. [place] fries. Summary  You can reduce your risk of kidney stones by making changes to your diet.  The most important thing you can do is drink enough fluid. You should drink enough fluid to keep your urine clear or pale yellow.  Ask your health care provider or dietitian how much protein from animal sources you should eat each day, and also how much salt and calcium you should have each day. This information is not intended to replace advice given to you by your health care provider. Make sure you discuss any questions you have with your health care provider. Document Revised: 03/09/2019 Document Reviewed: 10/28/2016 Elsevier Patient Education  2020 [employer].    Post Anesthesia Home Care Instructions  Activity: Get plenty of rest for the remainder of the day. A responsible individual must stay with you for 24 hours following the  procedure.  For the next 24 hours, DO NOT: -Drive a car -Advertising copywriter -Drink alcoholic beverages -Take any medication unless instructed by your physician -Make any legal decisions or sign important papers.  Meals: Start with liquid foods such as gelatin or soup. Progress to regular foods as tolerated. Avoid greasy, spicy, heavy foods. If nausea and/or vomiting occur, drink only clear liquids until the nausea and/or vomiting subsides. Call your physician if vomiting continues.  Special Instructions/Symptoms: Your throat may feel dry or sore from the anesthesia or the breathing tube placed in your throat during surgery. If this causes discomfort, gargle with warm salt water. The discomfort should disappear within 24 hours.  If you had a scopolamine patch placed behind your ear for the management of post- operative nausea and/or vomiting:  1. The medication in the patch is effective for 72 hours, after which it should be removed.  Wrap patch in a tissue and discard in the trash. Wash hands thoroughly with  soap and water. 2. You may remove the patch earlier than 72 hours if you experience unpleasant side effects which may include dry mouth, dizziness or visual disturbances. 3. Avoid touching the patch. Wash your hands with soap and water after contact with the patch.

## 2020-03-05 NOTE — H&P (Signed)
Office Visit Report     03/05/2020   --------------------------------------------------------------------------------   Bruce [name]  MRN: [id number]  DOB: [birth year], 59 year old Male   PRIMARY CARE:  C Melinda Crutch, MD  REFERRING:    PROVIDER:  Ellison Hughs, M.D.  LOCATION:  [facility] [phone]     --------------------------------------------------------------------------------   CC: I have pain in the flank.  HPI: Bruce [name] is a 59 year-old male patient who is here for flank pain.  The problem is on the right side. His pain started about approximately 12/02/2019. The pain is sharp. The pain is intermittent. The pain does radiate.   Nothing causes the pain to become worse. He was treated with the following pain medication(s): Percocet.   He has had this same pain previously. He has had kidney stones.   -Right LQ pain associated with LUTS for the past 3 months  -Sharp pain started on Saturday that has since been alleviated with tamsulosin and percocet     ALLERGIES: No Allergies    MEDICATIONS: Tamsulosin Hcl 0.4 mg capsule  Aspirin 81 MG TABS Oral  Coenzyme Q10  Ezetimibe 10 mg tablet  Fish Oil CAPS Oral  Olmesartan Medoxomil 40 mg tablet  Ondansetron Hcl 4 mg tablet  Oxycodone-Acetaminophen 5 mg-325 mg tablet     GU PSH: None     PSH Notes: Back Surgery   NON-GU PSH: None   GU PMH: BPH w/LUTS, Benign prostatic hyperplasia with urinary obstruction - 2014 History of urolithiasis, Nephrolithiasis - 2014 Ureteral calculus, Calculus of ureter - 2014      PMH Notes:  1898-12-01 00:00:00 - Note: Normal Routine History And Physical Adult   NON-GU PMH: Personal history of other diseases of the digestive system, History of esophageal reflux - 2014 Hypercholesterolemia Hypertension    FAMILY HISTORY: Family Health Status - Father alive at age 71 - 60 In Family Family Health Status - Mother's Age - Runs In Family Family Health Status  Number - Runs In Family Heart Attack - Father nephrolithiasis - Father   SOCIAL HISTORY: Marital Status: Married Current Smoking Status: Patient has never smoked.   Tobacco Use Assessment Completed: Used Tobacco in last 30 days? Social Drinker.  Drinks 1 caffeinated drink per day.     Notes: Alcohol Use, Occupation:, Marital History - Currently Married, Tobacco Use, Caffeine Use   REVIEW OF SYSTEMS:    GU Review Male:   Patient reports frequent urination, hard to postpone urination, and burning/ pain with urination. Patient denies get up at night to urinate, leakage of urine, stream starts and stops, trouble starting your stream, have to strain to urinate , erection problems, and penile pain.  Gastrointestinal (Upper):   Patient reports nausea and vomiting. Patient denies indigestion/ heartburn.  Gastrointestinal (Lower):   Patient denies diarrhea and constipation.  Constitutional:   Patient denies weight loss, fatigue, night sweats, and fever.  Skin:   Patient denies skin rash/ lesion and itching.  Eyes:   Patient denies blurred vision and double vision.  Ears/ Nose/ Throat:   Patient denies sore throat and sinus problems.  Hematologic/Lymphatic:   Patient denies swollen glands and easy bruising.  Cardiovascular:   Patient denies leg swelling and chest pains.  Respiratory:   Patient denies cough and shortness of breath.  Endocrine:   Patient denies excessive thirst.  Musculoskeletal:   Patient reports back pain. Patient denies joint pain.  Neurological:   Patient denies headaches and dizziness.  Psychologic:   Patient  denies depression and anxiety.   VITAL SIGNS:      03/05/2020 09:19 AM  Weight 240 lb / 108.86 kg  Height 77 in / 195.58 cm  BP 121/77 mmHg  Heart Rate 83 /min  Temperature 97.8 F / 36.5 C  BMI 28.5 kg/m   MULTI-SYSTEM PHYSICAL EXAMINATION:    Constitutional: Well-nourished. No physical deformities. Normally developed. Good grooming.  Neurologic / Psychiatric:  Oriented to time, oriented to place, oriented to person. No depression, no anxiety, no agitation.  Musculoskeletal: Normal gait and station of head and neck.     PAST DATA REVIEWED:  Source Of History:  Patient  X-Ray Review: C.T. Abdomen/Pelvis: Reviewed Films. Reviewed Report. Discussed With Patient.    Notes:                     CLINICAL DATA: Microhematuria.   EXAM:  CT ABDOMEN AND PELVIS WITHOUT AND WITH CONTRAST   TECHNIQUE:  Multidetector CT imaging of the abdomen and pelvis was performed  following the standard protocol before and following the bolus  administration of intravenous contrast.   CONTRAST: 125 mL Omnipaque 300   COMPARISON: MRI evaluation dated 05/02/2018   FINDINGS:  Lower chest: Moderate-sized Bochdalek's hernia on the left. Lungs  are clear. No consolidation or pleural effusion.   Hepatobiliary: Similar appearance of biliary ductal dilation  following cholecystectomy as compared to previous exam. No signs of  focal hepatic lesion. 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 This varix extends posterior  to the pancreatic head and measures 2.3 x 1.8 cm, unchanged compared  to the study of 05/02/2018   Pancreas: Pancreas is normal.   Spleen: Spleen is normal size without signs of splenic lesion. 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   Adrenals/Urinary Tract: Normal adrenal glands. Smooth contour  bilateral kidneys without signs of hydronephrosis.   Excretory images showing no evidence of filling defect or secondary  sign of stricture. Urinary bladder with limited assessment partially  opacified with contrast. No urinary tract calculi.   Stomach/Bowel: Appendix is normal. No signs of acute bowel process.   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 Calcified atherosclerotic changes  of the abdominal aorta and noncalcified plaque. No adenopathy. No  pelvic adenopathy.   Reproductive: CT appearance of uterus and adnexa is normal.   Other: No abdominal wall hernia or abnormality. No abdominopelvic  ascites.   Musculoskeletal: No acute bone finding. No destructive bone process.  Spinal degenerative changes.   IMPRESSION:  1. No evidence of urinary tract calculi or urothelial lesion  2. Similar appearance of biliary ductal dilation following  cholecystectomy.  3. 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 Perhaps this relates to prior cholecystectomy. Correlate  with any history of complications during this procedure. The  findings are stable. The signs of portosystemic collaterals in the  left upper quadrant suggest portal hypertension. Correlation with  patient history and with any evidence of liver disease is suggested.  Liver is not frankly cirrhotic in terms of its morphology.  4. 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  5. Moderate-sized  Bochdalek's hernia on the left.   Aortic Atherosclerosis (ICD10-I70.0).    Electronically Signed  By: Donzetta Kohut M.D.  On: 02/15/2020 14:56     PROCEDURES:          Urinalysis w/Scope Dipstick Dipstick Cont'd Micro  Color: Yellow Bilirubin: Neg mg/dL WBC/hpf: 0 - 5/hpf  Appearance: Clear Ketones: Neg mg/dL RBC/hpf: 0 - 2/hpf  Specific Gravity: 1.025 Blood: Trace ery/uL Bacteria: Rare (0-9/hpf)  pH: <=5.0 Protein: Neg mg/dL Cystals: NS (Not Seen)  Glucose: Neg mg/dL Urobilinogen: 0.2 mg/dL Casts: NS (Not Seen)    Nitrites: Neg Trichomonas: Not Present    Leukocyte Esterase: Neg leu/uL Mucous: Not Present       Epithelial Cells: 0 - 5/hpf      Yeast: NS (Not Seen)      Sperm: Not Present    ASSESSMENT:      ICD-10 Details  1 GU:   Flank Pain - R10.84 Undiagnosed New Problem  2   Ureteral calculus - N20.1 Undiagnosed New Problem   PLAN:           Schedule Return Visit/Planned Activity: ASAP - Schedule Surgery          Document Letter(s):  Created for Patient: Clinical Summary         Notes:   The risks, benefits and alternatives of RIGHT ESWL was discussed with the patient. I described the risks which include arrhythmia, kidney contusion, kidney hemorrhage, need for transfusion, back discomfort, flank ecchymosis, flank abrasion, inability to break up stone, inability to pass stone fragments, Steinstrasse, infection associated with obstructing stones, need for different surgical procedure and possible need for repeat shockwave lithotripsy. The patient voices understanding and wishes to proceed.

## 2020-03-19 DIAGNOSIS — R1084 Generalized abdominal pain: Secondary | ICD-10-CM | POA: Diagnosis not present

## 2020-03-19 DIAGNOSIS — N201 Calculus of ureter: Secondary | ICD-10-CM | POA: Diagnosis not present

## 2020-04-03 DIAGNOSIS — L718 Other rosacea: Secondary | ICD-10-CM | POA: Diagnosis not present

## 2020-04-03 DIAGNOSIS — D225 Melanocytic nevi of trunk: Secondary | ICD-10-CM | POA: Diagnosis not present

## 2020-04-03 DIAGNOSIS — X32XXXD Exposure to sunlight, subsequent encounter: Secondary | ICD-10-CM | POA: Diagnosis not present

## 2020-04-03 DIAGNOSIS — L570 Actinic keratosis: Secondary | ICD-10-CM | POA: Diagnosis not present

## 2020-04-12 ENCOUNTER — Other Ambulatory Visit: Payer: Self-pay | Admitting: Cardiovascular Disease

## 2020-04-12 DIAGNOSIS — Z23 Encounter for immunization: Secondary | ICD-10-CM | POA: Diagnosis not present

## 2020-04-12 DIAGNOSIS — Z0000 Encounter for general adult medical examination without abnormal findings: Secondary | ICD-10-CM | POA: Diagnosis not present

## 2020-04-18 DIAGNOSIS — N201 Calculus of ureter: Secondary | ICD-10-CM | POA: Diagnosis not present

## 2020-04-18 DIAGNOSIS — R8271 Bacteriuria: Secondary | ICD-10-CM | POA: Diagnosis not present

## 2020-04-25 DIAGNOSIS — Z125 Encounter for screening for malignant neoplasm of prostate: Secondary | ICD-10-CM | POA: Diagnosis not present

## 2020-04-25 DIAGNOSIS — Z0000 Encounter for general adult medical examination without abnormal findings: Secondary | ICD-10-CM | POA: Diagnosis not present

## 2020-04-25 DIAGNOSIS — Z13220 Encounter for screening for lipoid disorders: Secondary | ICD-10-CM | POA: Diagnosis not present

## 2020-05-13 ENCOUNTER — Other Ambulatory Visit: Payer: Self-pay | Admitting: Cardiovascular Disease

## 2020-05-20 ENCOUNTER — Encounter (HOSPITAL_COMMUNITY): Payer: Self-pay | Admitting: Emergency Medicine

## 2020-05-20 ENCOUNTER — Emergency Department (HOSPITAL_COMMUNITY): Payer: BC Managed Care – PPO

## 2020-05-20 ENCOUNTER — Other Ambulatory Visit: Payer: Self-pay

## 2020-05-20 ENCOUNTER — Emergency Department (HOSPITAL_COMMUNITY)
Admission: EM | Admit: 2020-05-20 | Discharge: 2020-05-20 | Disposition: A | Discharge status: Home / Self Care | Payer: BC Managed Care – PPO | Attending: Emergency Medicine | Admitting: Emergency Medicine

## 2020-05-20 ENCOUNTER — Emergency Department (HOSPITAL_COMMUNITY)
Admission: EM | Admit: 2020-05-20 | Discharge: 2020-05-20 | Discharge status: Against Medical Advice | Payer: BC Managed Care – PPO

## 2020-05-20 DIAGNOSIS — Z79899 Other long term (current) drug therapy: Secondary | ICD-10-CM | POA: Insufficient documentation

## 2020-05-20 DIAGNOSIS — I10 Essential (primary) hypertension: Secondary | ICD-10-CM | POA: Insufficient documentation

## 2020-05-20 DIAGNOSIS — N23 Unspecified renal colic: Secondary | ICD-10-CM | POA: Diagnosis not present

## 2020-05-20 DIAGNOSIS — R109 Unspecified abdominal pain: Secondary | ICD-10-CM | POA: Diagnosis not present

## 2020-05-20 LAB — CBC WITH DIFFERENTIAL/PLATELET
Abs Immature Granulocytes: 0.04 10*3/uL (ref 0.00–0.07)
Basophils Absolute: 0 10*3/uL (ref 0.0–0.1)
Basophils Relative: 0 %
Eosinophils Absolute: 0 10*3/uL (ref 0.0–0.5)
Eosinophils Relative: 0 %
HCT: 47 % (ref 39.0–52.0)
Hemoglobin: 16.1 g/dL (ref 13.0–17.0)
Immature Granulocytes: 0 %
Lymphocytes Relative: 8 %
Lymphs Abs: 1.1 10*3/uL (ref 0.7–4.0)
MCH: 31.9 pg (ref 26.0–34.0)
MCHC: 34.3 g/dL (ref 30.0–36.0)
MCV: 93.1 fL (ref 80.0–100.0)
Monocytes Absolute: 0.7 10*3/uL (ref 0.1–1.0)
Monocytes Relative: 5 %
Neutro Abs: 11.5 10*3/uL — ABNORMAL HIGH (ref 1.7–7.7)
Neutrophils Relative %: 87 %
Platelets: 211 10*3/uL (ref 150–400)
RBC: 5.05 MIL/uL (ref 4.22–5.81)
RDW: 11.4 % — ABNORMAL LOW (ref 11.5–15.5)
WBC: 13.3 10*3/uL — ABNORMAL HIGH (ref 4.0–10.5)
nRBC: 0 % (ref 0.0–0.2)

## 2020-05-20 LAB — BASIC METABOLIC PANEL
Anion gap: 9 (ref 5–15)
BUN: 20 mg/dL (ref 6–20)
CO2: 27 mmol/L (ref 22–32)
Calcium: 9.5 mg/dL (ref 8.9–10.3)
Chloride: 104 mmol/L (ref 98–111)
Creatinine, Ser: 1.19 mg/dL (ref 0.61–1.24)
GFR calc Af Amer: >60 mL/min (ref >60)
GFR calc non Af Amer: >60 mL/min (ref >60)
Glucose, Bld: 141 mg/dL — ABNORMAL HIGH (ref 70–99)
Potassium: 4.4 mmol/L (ref 3.5–5.1)
Sodium: 140 mmol/L (ref 135–145)

## 2020-05-20 LAB — URINALYSIS, ROUTINE W REFLEX MICROSCOPIC
Bacteria, UA: NONE SEEN
Bilirubin Urine: NEGATIVE
Glucose, UA: NEGATIVE mg/dL
Ketones, ur: 20 mg/dL — AB
Leukocytes,Ua: NEGATIVE
Nitrite: NEGATIVE
Protein, ur: NEGATIVE mg/dL
Specific Gravity, Urine: 1.02 (ref 1.005–1.030)
pH: 6 (ref 5.0–8.0)

## 2020-05-20 IMAGING — CT CT RENAL STONE PROTOCOL
2 of 4 series · 16 of 46 positions shown, 18 images · non-contrast
Comparison: CT abdomen pelvis dated [DATE]

CLINICAL DATA: Right flank pain.

EXAM:
CT ABDOMEN AND PELVIS WITHOUT CONTRAST
TECHNIQUE: Multidetector CT imaging of the abdomen and pelvis was performed
following the standard protocol without IV contrast.

[Series 3: ap without · axial · non-contrast · 0.83mm/px · z∈[+1033,+1408]mm · 13 of 83 slices shown, 15 images]
[im 4/83  soft-tissue]
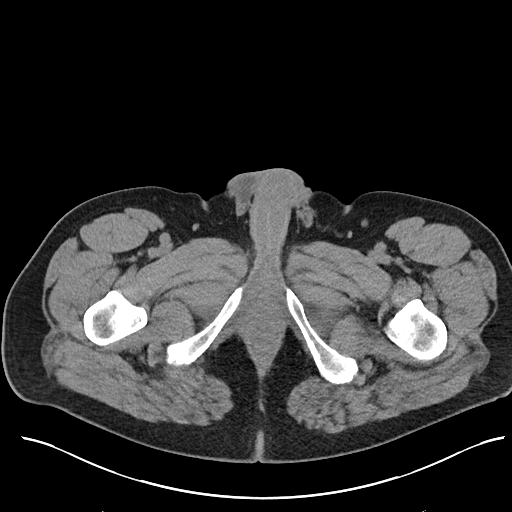
[im 4/83  bone]
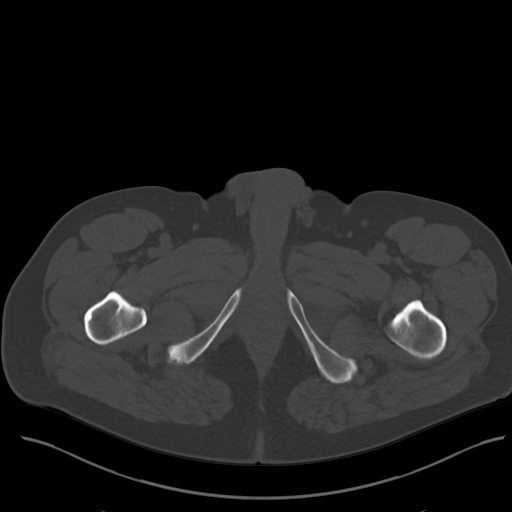
[im 10/83  soft-tissue]
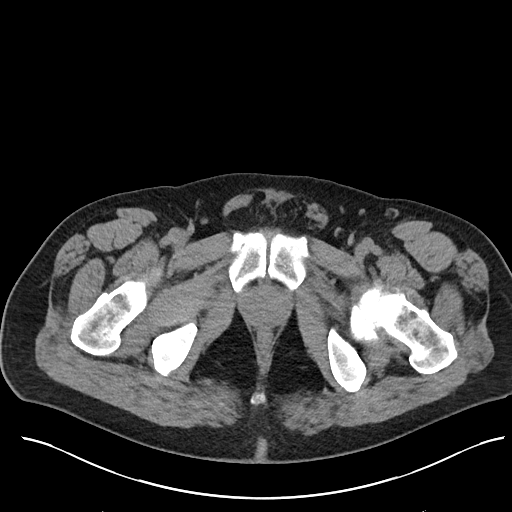
[im 17/83  soft-tissue]
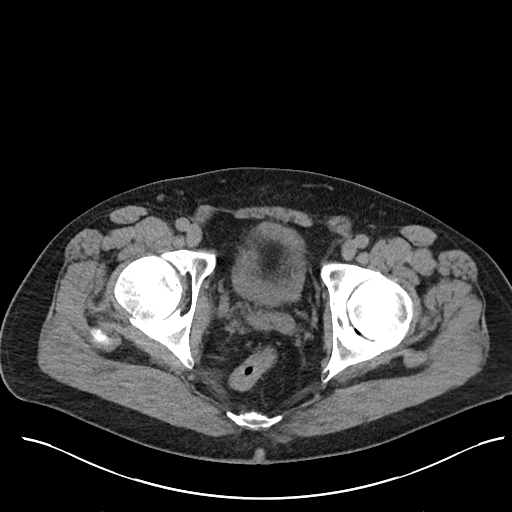
[im 23/83  soft-tissue]
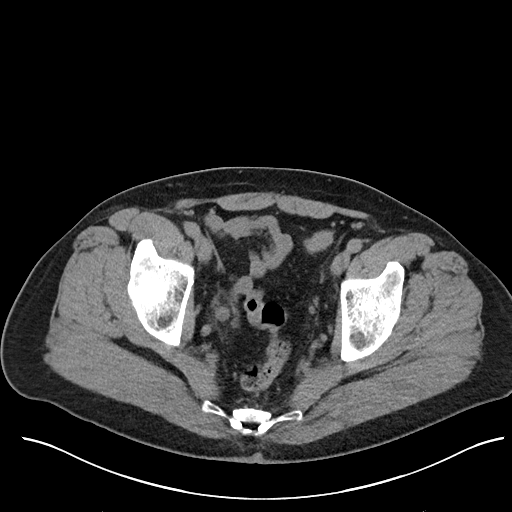
[im 30/83  soft-tissue]
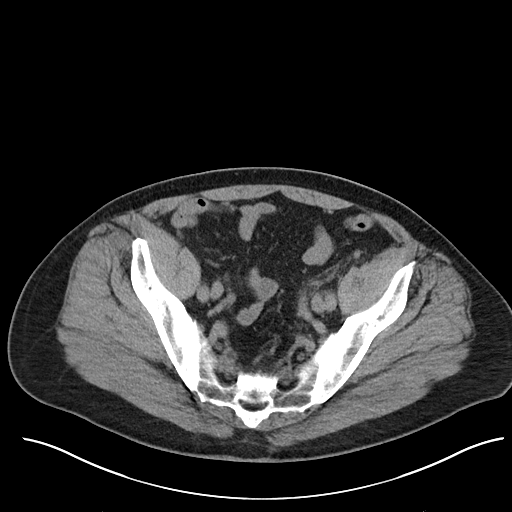
[im 37/83  soft-tissue]
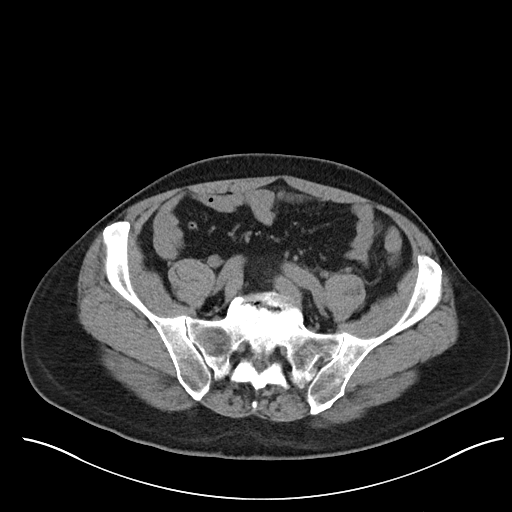
[im 43/83  soft-tissue]
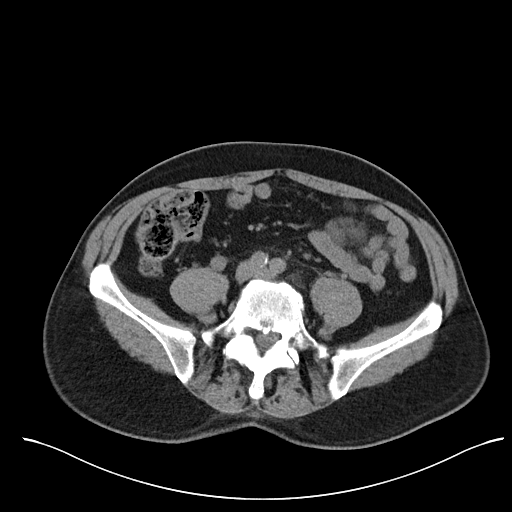
[im 46/83  soft-tissue]
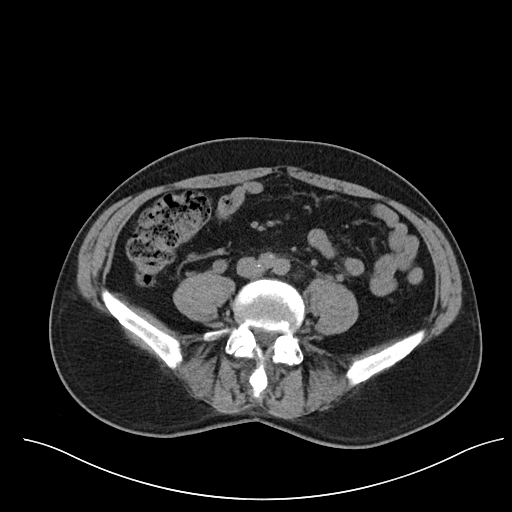
[im 53/83  soft-tissue]
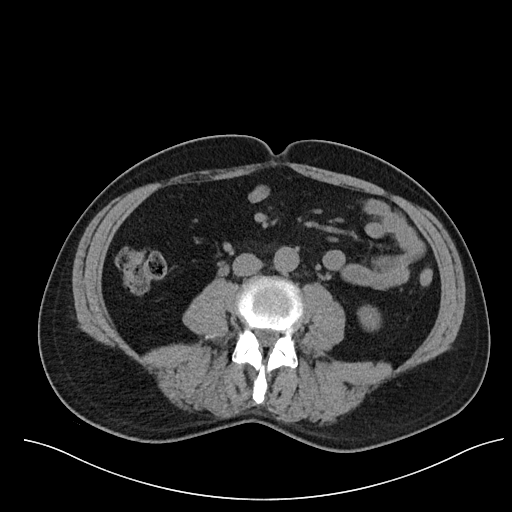
[im 53/83  bone]
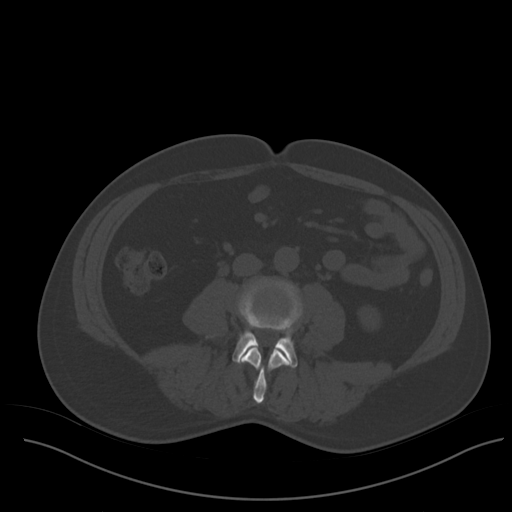
[im 60/83  soft-tissue]
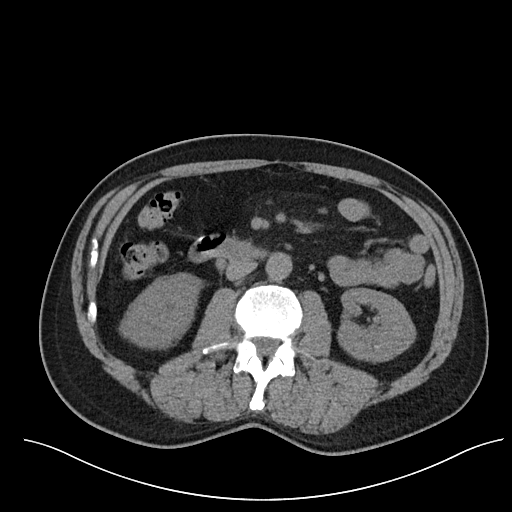
[im 66/83  soft-tissue]
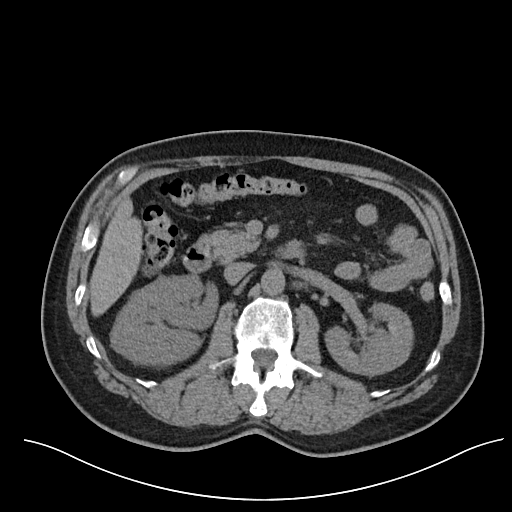
[im 73/83  soft-tissue]
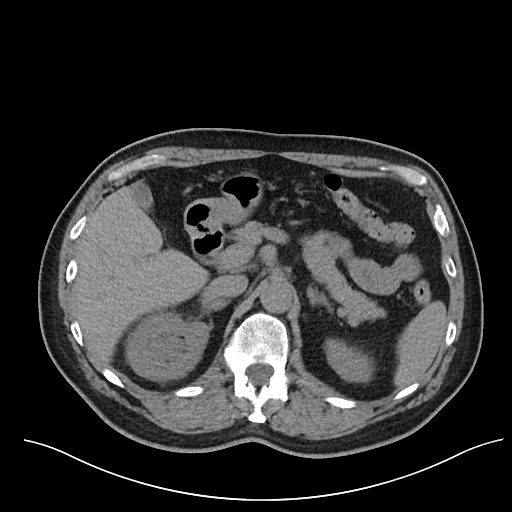
[im 79/83  soft-tissue]
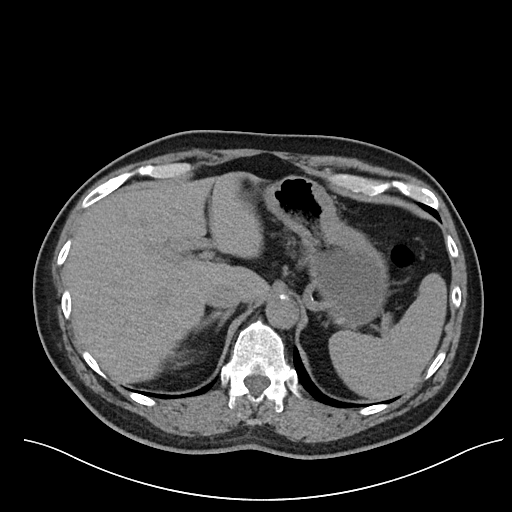

[Series 6: cor · coronal · 0.82mm/px · 3 of 94 slices shown]
[im 32/94  soft-tissue]
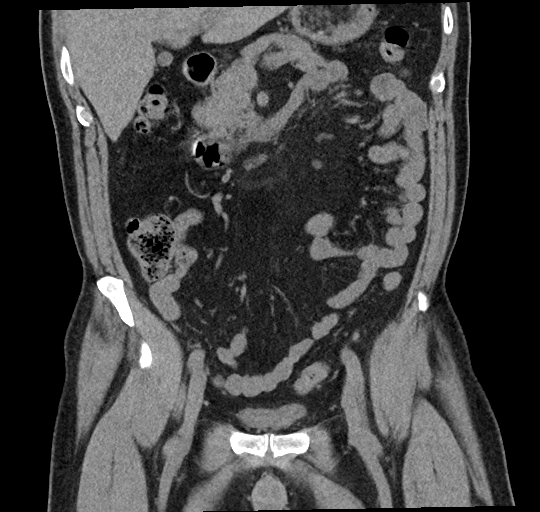
[im 42/94  soft-tissue]
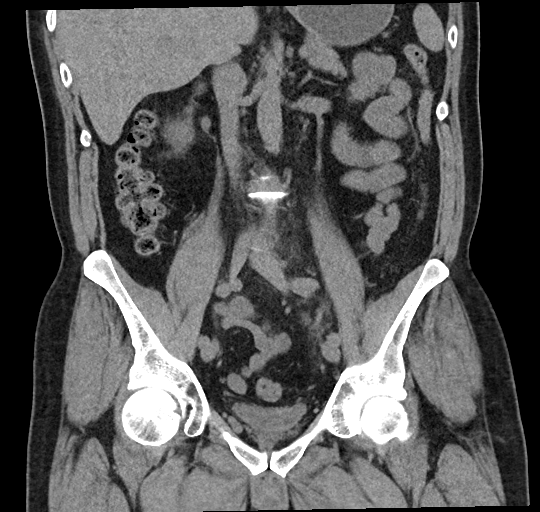
[im 52/94  soft-tissue]
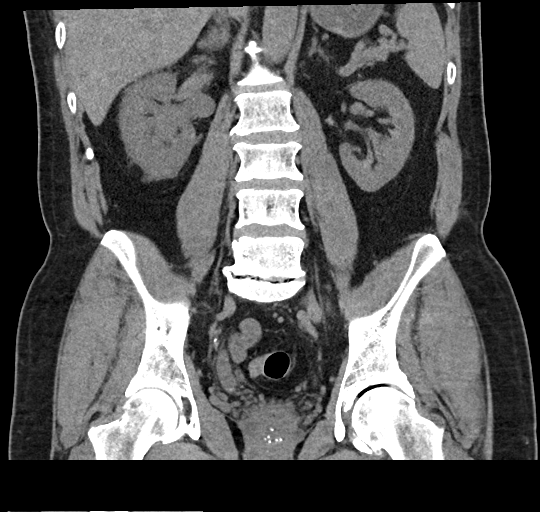

[16 of 46 positions shown; findings below may reference images not displayed]

FINDINGS: Lower chest: No acute abnormality.

Hepatobiliary: The liver is incompletely imaged. No focal liver
abnormality is seen. No gallstones, gallbladder wall thickening, or
biliary dilatation.

Pancreas: Unremarkable. No pancreatic ductal dilatation or
surrounding inflammatory changes.

Spleen: The spleen is incompletely imaged, given this limitation, no
focal abnormality is identified.

Adrenals/Urinary Tract: Adrenal glands are unremarkable. An
obstructing 1.3 cm distal right ureteral calculus is seen with
moderate right hydroureteronephrosis. This appears unchanged since
[DATE]. There is no focal lesion in the right kidney. The left
kidney appears normal without renal calculi, focal lesion, or
hydronephrosis. Bladder is decompressed.

Stomach/Bowel: The stomach is incompletely imaged, however the
visualized portions appear normal. Appendix appears normal. No
evidence of bowel wall thickening, distention, or inflammatory
changes.

Vascular/Lymphatic: Aortic atherosclerosis. No enlarged abdominal or
pelvic lymph nodes.

Reproductive: Prostate is unremarkable.

Other: No abdominal wall hernia or abnormality. No abdominopelvic
ascites.

Musculoskeletal: No acute or significant osseous findings.
IMPRESSION: 1. Obstructing 1.3 cm distal right ureteral calculus with moderate
right hydroureteronephrosis. The size and location of the ureteral
calculus as well as the degree of right hydroureteronephrosis
appears unchanged since [DATE].

Aortic Atherosclerosis ([07]-[07]).

These results were called by telephone at the time of interpretation
on [DATE] at [DATE] to provider MBOGE , who verbally
acknowledged these results.

## 2020-05-20 MED ORDER — MORPHINE SULFATE (PF) 4 MG/ML IV SOLN
4.0000 mg | Freq: Once | INTRAVENOUS | Status: AC
  Administered 2020-05-20: 4 mg via INTRAVENOUS
  Filled 2020-05-20: qty 1

## 2020-05-20 MED ORDER — SODIUM CHLORIDE 0.9 % IV BOLUS
1000.0000 mL | Freq: Once | INTRAVENOUS | Status: AC
  Administered 2020-05-20: 1000 mL via INTRAVENOUS

## 2020-05-20 MED ORDER — OXYCODONE-ACETAMINOPHEN 5-325 MG PO TABS: 1.0000 | ORAL_TABLET | ORAL | 0 refills | Status: DC | PRN

## 2020-05-20 MED ORDER — KETOROLAC TROMETHAMINE 15 MG/ML IJ SOLN
15.0000 mg | Freq: Once | INTRAMUSCULAR | Status: AC
  Administered 2020-05-20: 15 mg via INTRAVENOUS
  Filled 2020-05-20: qty 1

## 2020-05-20 NOTE — ED Triage Notes (Signed)
C/o R flank pain and RLQ pain x 2 hours with nausea.  Reports burning with urination.  Hx of kidney stones.

## 2020-05-20 NOTE — ED Provider Notes (Signed)
[place] EMERGENCY DEPARTMENT Provider Note   CSN: [id number] Arrival date & time: 05/20/20  1712     History No chief complaint on file.   Bruce [name] is a 59 y.o. male.  59 year old male with prior medical history as detailed below presents for evaluation of right-sided flank pain.  Patient reports prior history of renal colic.  He is known to Dr. Lovena Neighbours for same.  He reports lithotripsy on a stone on the right in early April.  Apparently he has had persistent right-sided ureteral stone.  He has a scheduled appointment for early this week with Dr. Lovena Neighbours.   The patient is presenting today complaining of increased right flank pain.  He took 2 oxycodone at home without improvement in his symptoms.  He denies fever.  He denies nausea or vomiting.  He reports that his pain as described today is consistent with prior episodes of renal colic.  The history is provided by the patient and medical records.  Flank Pain This is a recurrent problem. The current episode started 3 to 5 hours ago. The problem occurs rarely. The problem has been gradually improving. Nothing aggravates the symptoms. Nothing relieves the symptoms.       Past Medical History:  Diagnosis Date  . Arthritis    not diagnosed. hip  . History of kidney stones   . Hypertension   . Kidney stones   . Kidney stones   . Sleep apnea    wears cpap. 4.5 is one setting    Patient Active Problem List   Diagnosis Date Noted  . Trochanteric bursitis, left hip 01/11/2018  . Abnormal glucose level 10/14/2016  . Gastroesophageal reflux disease without esophagitis 10/14/2016  . Essential hypertension 12/13/2015  . OSA (obstructive sleep apnea) 08/29/2015  . Hyperlipidemia with target LDL less than 100 04/26/2013  . Family history of premature CAD 04/26/2013  . Snoring 04/26/2013    Past Surgical History:  Procedure Laterality Date  . BACK SURGERY     20 yrs ago  . EXTRACORPOREAL SHOCK WAVE  LITHOTRIPSY Right 03/05/2020   Procedure: EXTRACORPOREAL SHOCK WAVE LITHOTRIPSY (ESWL);  Surgeon: Ceasar Mons, MD;  Location: [facility];  Service: Urology;  Laterality: Right;  . ruptured disc  2002   Dr. Rolley Sims  . TONSILLECTOMY     as child       Family History  Problem Relation Age of Onset  . Heart attack Mother   . Hyperlipidemia Mother   . Hypertension Mother   . Heart attack Father   . Hyperlipidemia Father   . Colon cancer Maternal Grandmother   . Heart attack Paternal Grandmother   . Heart disease Paternal Grandmother   . Heart attack Paternal Grandfather   . Heart disease Paternal Grandfather     Social History   Tobacco Use  . Smoking status: Never Smoker  . Smokeless tobacco: Never Used  Substance Use Topics  . Alcohol use: Never    Alcohol/week: 0.0 standard drinks    Comment: rarely has a drink  . Drug use: Never    Home Medications Prior to Admission medications   Medication Sig Start Date End Date Taking? Authorizing Provider  Coenzyme Q10 (EQL COQ10) 300 MG CAPS Take 1 capsule by mouth daily.    [provider]  ezetimibe (ZETIA) 10 MG tablet Take 1 tablet (10 mg total) by mouth daily. 04/12/20   Troy Sine, MD  fish oil-omega-3 fatty acids 1000 MG capsule  Take by mouth daily. 1200 MG CAPS. Takes 4 daily.    [provider]  HYDROcodone-acetaminophen (NORCO) 5-325 MG tablet Take 1 tablet by mouth every 4 (four) hours as needed for moderate pain. 03/05/20   Rene Paci, MD  Multiple Vitamins-Minerals (ADULT GUMMY PO) Take 1 Dose by mouth 2 (two) times daily. Contains Vit C, zinc, elderberry and other    [provider]  olmesartan (BENICAR) 40 MG tablet TAKE 1 TABLET BY MOUTH EVERY DAY 05/15/20   Lennette Bihari, MD  ondansetron (ZOFRAN) 4 MG tablet Take 1 tablet (4 mg total) by mouth every 8 (eight) hours as needed for nausea or vomiting. 03/03/20   Arthor Captain, PA-C    oxyCODONE-acetaminophen (PERCOCET) 5-325 MG tablet Take 1-2 tablets by mouth every 6 (six) hours as needed. 03/03/20   Arthor Captain, PA-C  tamsulosin (FLOMAX) 0.4 MG CAPS capsule Take 1 capsule (0.4 mg total) by mouth 2 (two) times daily. 03/03/20   Arthor Captain, PA-C  tamsulosin (FLOMAX) 0.4 MG CAPS capsule Take 1 capsule (0.4 mg total) by mouth daily. 03/05/20   Rene Paci, MD    Allergies    Patient has no known allergies.  Review of Systems   Review of Systems  Genitourinary: Positive for flank pain.  All other systems reviewed and are negative.   Physical Exam Updated Vital Signs BP (!) 158/96 (BP Location: Right Arm)   Pulse 76   Temp 97.7 F (36.5 C) (Oral)   Resp 16   SpO2 97%   Physical Exam Vitals and nursing note reviewed.  Constitutional:      General: He is not in acute distress.    Appearance: Normal appearance. He is well-developed.  HENT:     Head: Normocephalic and atraumatic.  Eyes:     Conjunctiva/sclera: Conjunctivae normal.     Pupils: Pupils are equal, round, and reactive to light.  Cardiovascular:     Rate and Rhythm: Normal rate and regular rhythm.     Heart sounds: Normal heart sounds.  Pulmonary:     Effort: Pulmonary effort is normal. No respiratory distress.     Breath sounds: Normal breath sounds.  Abdominal:     General: There is no distension.     Palpations: Abdomen is soft.     Tenderness: There is no abdominal tenderness.  Genitourinary:    Comments: Mild right-sided CVA tenderness Musculoskeletal:        General: No deformity. Normal range of motion.     Cervical back: Normal range of motion and neck supple.  Skin:    General: Skin is warm and dry.  Neurological:     Mental Status: He is alert and oriented to person, place, and time.     ED Results / Procedures / Treatments   Labs (all labs ordered are listed, but only abnormal results are displayed) Labs Reviewed  URINALYSIS, ROUTINE W REFLEX MICROSCOPIC -  Abnormal; Notable for the following components:      Result Value   Hgb urine dipstick MODERATE (*)    Ketones, ur 20 (*)    All other components within normal limits  BASIC METABOLIC PANEL - Abnormal; Notable for the following components:   Glucose, Bld 141 (*)    All other components within normal limits  CBC WITH DIFFERENTIAL/PLATELET - Abnormal; Notable for the following components:   WBC 13.3 (*)    RDW 11.4 (*)    Neutro Abs 11.5 (*)    All other components  within normal limits    EKG None  Radiology CT Renal Stone Study  Result Date: 05/20/2020 CLINICAL DATA:  Right flank pain. EXAM: CT ABDOMEN AND PELVIS WITHOUT CONTRAST TECHNIQUE: Multidetector CT imaging of the abdomen and pelvis was performed following the standard protocol without IV contrast. COMPARISON:  CT abdomen pelvis dated 03/03/2020 FINDINGS: Lower chest: No acute abnormality. Hepatobiliary: The liver is incompletely imaged. No focal liver abnormality is seen. No gallstones, gallbladder wall thickening, or biliary dilatation. Pancreas: Unremarkable. No pancreatic ductal dilatation or surrounding inflammatory changes. Spleen: The spleen is incompletely imaged, given this limitation, no focal abnormality is identified. Adrenals/Urinary Tract: Adrenal glands are unremarkable. An obstructing 1.3 cm distal right ureteral calculus is seen with moderate right hydroureteronephrosis. This appears unchanged since 03/03/2020. There is no focal lesion in the right kidney. The left kidney appears normal without renal calculi, focal lesion, or hydronephrosis. Bladder is decompressed. Stomach/Bowel: The stomach is incompletely imaged, however the visualized portions appear normal. Appendix appears normal. No evidence of bowel wall thickening, distention, or inflammatory changes. Vascular/Lymphatic: Aortic atherosclerosis. No enlarged abdominal or pelvic lymph nodes. Reproductive: Prostate is unremarkable. Other: No abdominal wall hernia or  abnormality. No abdominopelvic ascites. Musculoskeletal: No acute or significant osseous findings. IMPRESSION: 1. Obstructing 1.3 cm distal right ureteral calculus with moderate right hydroureteronephrosis. The size and location of the ureteral calculus as well as the degree of right hydroureteronephrosis appears unchanged since 03/03/2020. Aortic Atherosclerosis (ICD10-I70.0). These results were called by telephone at the time of interpretation on 05/20/2020 at 7:28 pm to provider [facility] , who verbally acknowledged these results. Electronically Signed   By: Romona Curls M.D.   On: 05/20/2020 19:34    Procedures Procedures (including critical care time)  Medications Ordered in ED Medications  sodium chloride 0.9 % bolus 1,000 mL (has no administration in time range)  ketorolac (TORADOL) 15 MG/ML injection 15 mg (has no administration in time range)  morphine 4 MG/ML injection 4 mg (has no administration in time range)    ED Course  I have reviewed the triage vital signs and the nursing notes.  Pertinent labs & imaging results that were available during my care of the patient were reviewed by me and considered in my medical decision making (see chart for details).  Clinical Course as of May 21 1947  Sun May 20, 2020  1933 Lymphocytes: 8 [PM]    Clinical Course User Index [PM] Wynetta Fines, MD   MDM Rules/Calculators/A&P                          MDM  Screen complete  Bruce [name] was evaluated in Emergency Department on 05/20/2020 for the symptoms described in the history of present illness. He was evaluated in the context of the global COVID-19 pandemic, which necessitated consideration that the patient might be at risk for infection with the SARS-CoV-2 virus that causes COVID-19. Institutional protocols and algorithms that pertain to the evaluation of patients at risk for COVID-19 are in a state of rapid change based on information released by regulatory bodies including the  CDC and federal and state organizations. These policies and algorithms were followed during the patient's care in the ED.  Patient is presenting for evaluation of right-sided renal colic.  Patient with prior history of same.  Patient is well-known to Dr. Liliane Shi of urology.  Work-up did not reveal evidence of other significant acute pathology.  Patient does feel improved following his  ED evaluation.  Case discussed with on-call urology Arita Miss) who recommends that the patient contact the office first thing in the morning for evaluation and possible intervention.  Final Clinical Impression(s) / ED Diagnoses Final diagnoses:  Renal colic    Rx / DC Orders ED Discharge Orders         Ordered    oxyCODONE-acetaminophen (PERCOCET/ROXICET) 5-325 MG tablet  Every 4 hours PRN     Discontinue  Reprint     05/20/20 2214           Wynetta Fines, MD 05/20/20 2230

## 2020-05-20 NOTE — Discharge Instructions (Signed)
Please return for any problem.    Call Dr. Alphonsa Overall (Urology) office tomorrow morning as instructed for follow-up.  Do not eat or drink after midnight.

## 2020-05-20 NOTE — ED Notes (Signed)
Patient verbalizes understanding of discharge instructions. Opportunity for questioning and answers were provided. Armband removed by staff, pt discharged from ED in wheelchair to home.   

## 2020-05-21 ENCOUNTER — Other Ambulatory Visit: Payer: Self-pay | Admitting: Urology

## 2020-05-21 DIAGNOSIS — N201 Calculus of ureter: Secondary | ICD-10-CM | POA: Diagnosis not present

## 2020-05-21 DIAGNOSIS — R8271 Bacteriuria: Secondary | ICD-10-CM | POA: Diagnosis not present

## 2020-05-22 ENCOUNTER — Other Ambulatory Visit: Payer: Self-pay

## 2020-05-22 ENCOUNTER — Other Ambulatory Visit (HOSPITAL_COMMUNITY)
Admission: RE | Admit: 2020-05-22 | Discharge: 2020-05-22 | Disposition: A | Discharge status: Home / Self Care | Payer: BC Managed Care – PPO | Source: Ambulatory Visit | Attending: Urology | Admitting: Urology

## 2020-05-22 ENCOUNTER — Encounter (HOSPITAL_BASED_OUTPATIENT_CLINIC_OR_DEPARTMENT_OTHER): Payer: Self-pay | Admitting: Urology

## 2020-05-22 DIAGNOSIS — Z20822 Contact with and (suspected) exposure to covid-19: Secondary | ICD-10-CM | POA: Insufficient documentation

## 2020-05-22 DIAGNOSIS — Z01812 Encounter for preprocedural laboratory examination: Secondary | ICD-10-CM | POA: Insufficient documentation

## 2020-05-22 LAB — SARS CORONAVIRUS 2 (TAT 6-24 HRS): SARS Coronavirus 2: NEGATIVE

## 2020-05-22 NOTE — Progress Notes (Addendum)
Spoke w/ via phone for pre-op interview---Pt Lab needs dos----  I stat 8, ekg            COVID test ------05-22-2020@250  pm Arrive at -------1300 05-25-2020 No food after midnight clear liquids until 900 am then npo Medications to take morning of surgery -----tamsulosin Diabetic medication -----n/a Patient Special Instructions -----bring cpap mask tubing and machine and leave in car Pre-Op special Istructions -----patient instructed per dr winter to stop 81 mg aspirin 05-21-2020 Patient verbalized understanding of instructions that were given at this phone interview. Patient denies shortness of breath, chest pain, fever, cough a this phone interview.  PATIENT HAS RING STUCK ON LEFT 4TH FINGER WILL TRY TO REMOVE

## 2020-05-25 ENCOUNTER — Encounter (HOSPITAL_BASED_OUTPATIENT_CLINIC_OR_DEPARTMENT_OTHER): Payer: Self-pay | Admitting: Urology

## 2020-05-25 ENCOUNTER — Ambulatory Visit (HOSPITAL_BASED_OUTPATIENT_CLINIC_OR_DEPARTMENT_OTHER): Payer: BC Managed Care – PPO | Admitting: Anesthesiology

## 2020-05-25 ENCOUNTER — Ambulatory Visit (HOSPITAL_BASED_OUTPATIENT_CLINIC_OR_DEPARTMENT_OTHER)
Admission: RE | Admit: 2020-05-25 | Discharge: 2020-05-25 | Disposition: A | Discharge status: Home / Self Care | Payer: BC Managed Care – PPO | Attending: Urology | Admitting: Urology

## 2020-05-25 ENCOUNTER — Encounter (HOSPITAL_BASED_OUTPATIENT_CLINIC_OR_DEPARTMENT_OTHER)
Admission: RE | Disposition: A | Discharge status: Home / Self Care | Payer: Self-pay | Source: Home / Self Care | Attending: Urology

## 2020-05-25 DIAGNOSIS — E785 Hyperlipidemia, unspecified: Secondary | ICD-10-CM | POA: Diagnosis not present

## 2020-05-25 DIAGNOSIS — N201 Calculus of ureter: Secondary | ICD-10-CM | POA: Diagnosis not present

## 2020-05-25 DIAGNOSIS — M1990 Unspecified osteoarthritis, unspecified site: Secondary | ICD-10-CM | POA: Diagnosis not present

## 2020-05-25 DIAGNOSIS — G4730 Sleep apnea, unspecified: Secondary | ICD-10-CM | POA: Insufficient documentation

## 2020-05-25 DIAGNOSIS — Z79899 Other long term (current) drug therapy: Secondary | ICD-10-CM | POA: Insufficient documentation

## 2020-05-25 DIAGNOSIS — Z7982 Long term (current) use of aspirin: Secondary | ICD-10-CM | POA: Insufficient documentation

## 2020-05-25 DIAGNOSIS — K219 Gastro-esophageal reflux disease without esophagitis: Secondary | ICD-10-CM | POA: Insufficient documentation

## 2020-05-25 DIAGNOSIS — I10 Essential (primary) hypertension: Secondary | ICD-10-CM | POA: Insufficient documentation

## 2020-05-25 DIAGNOSIS — Z87442 Personal history of urinary calculi: Secondary | ICD-10-CM | POA: Insufficient documentation

## 2020-05-25 DIAGNOSIS — G4733 Obstructive sleep apnea (adult) (pediatric): Secondary | ICD-10-CM | POA: Diagnosis not present

## 2020-05-25 HISTORY — DX: Gastro-esophageal reflux disease without esophagitis: K21.9

## 2020-05-25 HISTORY — PX: CYSTOSCOPY/URETEROSCOPY/HOLMIUM LASER/STENT PLACEMENT: SHX6546

## 2020-05-25 LAB — POCT I-STAT, CHEM 8
BUN: 23 mg/dL — ABNORMAL HIGH (ref 6–20)
Calcium, Ion: 1.25 mmol/L (ref 1.15–1.40)
Chloride: 103 mmol/L (ref 98–111)
Creatinine, Ser: 1 mg/dL (ref 0.61–1.24)
Glucose, Bld: 122 mg/dL — ABNORMAL HIGH (ref 70–99)
HCT: 48 % (ref 39.0–52.0)
Hemoglobin: 16.3 g/dL (ref 13.0–17.0)
Potassium: 4.3 mmol/L (ref 3.5–5.1)
Sodium: 141 mmol/L (ref 135–145)
TCO2: 28 mmol/L (ref 22–32)

## 2020-05-25 SURGERY — CYSTOSCOPY/URETEROSCOPY/HOLMIUM LASER/STENT PLACEMENT
Anesthesia: General | Site: Ureter | Laterality: Right

## 2020-05-25 MED ORDER — OXYCODONE HCL 5 MG/5ML PO SOLN: 5.0000 mg | Freq: Once | ORAL | Status: DC | PRN

## 2020-05-25 MED ORDER — LIDOCAINE 2% (20 MG/ML) 5 ML SYRINGE
INTRAMUSCULAR | Status: DC | PRN
  Administered 2020-05-25: 60 mg via INTRAVENOUS

## 2020-05-25 MED ORDER — DEXAMETHASONE SODIUM PHOSPHATE 10 MG/ML IJ SOLN
INTRAMUSCULAR | Status: DC | PRN
  Administered 2020-05-25: 10 mg via INTRAVENOUS

## 2020-05-25 MED ORDER — CEFAZOLIN SODIUM-DEXTROSE 2-4 GM/100ML-% IV SOLN
2.0000 g | Freq: Once | INTRAVENOUS | Status: AC
  Administered 2020-05-25: 2 g via INTRAVENOUS

## 2020-05-25 MED ORDER — MIDAZOLAM HCL 2 MG/2ML IJ SOLN
INTRAMUSCULAR | Status: AC
  Filled 2020-05-25: qty 2

## 2020-05-25 MED ORDER — LIDOCAINE 2% (20 MG/ML) 5 ML SYRINGE
INTRAMUSCULAR | Status: AC
  Filled 2020-05-25: qty 5

## 2020-05-25 MED ORDER — KETOROLAC TROMETHAMINE 30 MG/ML IJ SOLN
INTRAMUSCULAR | Status: AC
  Filled 2020-05-25: qty 1

## 2020-05-25 MED ORDER — PHENYLEPHRINE 40 MCG/ML (10ML) SYRINGE FOR IV PUSH (FOR BLOOD PRESSURE SUPPORT)
PREFILLED_SYRINGE | INTRAVENOUS | Status: AC
  Filled 2020-05-25: qty 10

## 2020-05-25 MED ORDER — MIDAZOLAM HCL 5 MG/5ML IJ SOLN
INTRAMUSCULAR | Status: DC | PRN
  Administered 2020-05-25: 2 mg via INTRAVENOUS

## 2020-05-25 MED ORDER — PROPOFOL 10 MG/ML IV BOLUS
INTRAVENOUS | Status: DC | PRN
  Administered 2020-05-25: 200 mg via INTRAVENOUS

## 2020-05-25 MED ORDER — EPHEDRINE SULFATE-NACL 50-0.9 MG/10ML-% IV SOSY
PREFILLED_SYRINGE | INTRAVENOUS | Status: DC | PRN
  Administered 2020-05-25 (×2): 10 mg via INTRAVENOUS

## 2020-05-25 MED ORDER — ONDANSETRON HCL 4 MG/2ML IJ SOLN
INTRAMUSCULAR | Status: DC | PRN
  Administered 2020-05-25: 4 mg via INTRAVENOUS

## 2020-05-25 MED ORDER — FENTANYL CITRATE (PF) 100 MCG/2ML IJ SOLN
INTRAMUSCULAR | Status: DC | PRN
  Administered 2020-05-25 (×2): 50 ug via INTRAVENOUS

## 2020-05-25 MED ORDER — FENTANYL CITRATE (PF) 100 MCG/2ML IJ SOLN
INTRAMUSCULAR | Status: AC
  Filled 2020-05-25: qty 2

## 2020-05-25 MED ORDER — PROMETHAZINE HCL 25 MG/ML IJ SOLN: 6.2500 mg | INTRAMUSCULAR | Status: DC | PRN

## 2020-05-25 MED ORDER — LACTATED RINGERS IV SOLN: INTRAVENOUS | Status: DC

## 2020-05-25 MED ORDER — OXYBUTYNIN CHLORIDE 5 MG PO TABS: 5.0000 mg | ORAL_TABLET | Freq: Three times a day (TID) | ORAL | 1 refills | Status: DC | PRN

## 2020-05-25 MED ORDER — EPHEDRINE 5 MG/ML INJ
INTRAVENOUS | Status: AC
  Filled 2020-05-25: qty 10

## 2020-05-25 MED ORDER — SODIUM CHLORIDE 0.9 % IR SOLN
Status: DC | PRN
  Administered 2020-05-25: 3000 mL via INTRAVESICAL

## 2020-05-25 MED ORDER — ONDANSETRON HCL 4 MG/2ML IJ SOLN
INTRAMUSCULAR | Status: AC
  Filled 2020-05-25: qty 2

## 2020-05-25 MED ORDER — IOHEXOL 300 MG/ML  SOLN
INTRAMUSCULAR | Status: DC | PRN
  Administered 2020-05-25: 8 mL

## 2020-05-25 MED ORDER — KETOROLAC TROMETHAMINE 30 MG/ML IJ SOLN
INTRAMUSCULAR | Status: DC | PRN
Start: 2020-05-25 — End: 2020-05-25
  Administered 2020-05-25: 15 mg via INTRAVENOUS

## 2020-05-25 MED ORDER — FENTANYL CITRATE (PF) 100 MCG/2ML IJ SOLN: 25.0000 ug | INTRAMUSCULAR | Status: DC | PRN

## 2020-05-25 MED ORDER — PROPOFOL 10 MG/ML IV BOLUS
INTRAVENOUS | Status: AC
  Filled 2020-05-25: qty 20

## 2020-05-25 MED ORDER — OXYCODONE HCL 5 MG PO TABS: 5.0000 mg | ORAL_TABLET | Freq: Once | ORAL | Status: DC | PRN

## 2020-05-25 MED ORDER — DEXAMETHASONE SODIUM PHOSPHATE 10 MG/ML IJ SOLN
INTRAMUSCULAR | Status: AC
  Filled 2020-05-25: qty 1

## 2020-05-25 MED ORDER — CEFAZOLIN SODIUM-DEXTROSE 2-4 GM/100ML-% IV SOLN
INTRAVENOUS | Status: AC
  Filled 2020-05-25: qty 100

## 2020-05-25 MED ORDER — PHENYLEPHRINE 40 MCG/ML (10ML) SYRINGE FOR IV PUSH (FOR BLOOD PRESSURE SUPPORT)
PREFILLED_SYRINGE | INTRAVENOUS | Status: DC | PRN
  Administered 2020-05-25: 80 ug via INTRAVENOUS
  Administered 2020-05-25: 120 ug via INTRAVENOUS
  Administered 2020-05-25: 80 ug via INTRAVENOUS
  Administered 2020-05-25: 120 ug via INTRAVENOUS

## 2020-05-25 SURGICAL SUPPLY — 25 items
APL SKNCLS STERI-STRIP NONHPOA (GAUZE/BANDAGES/DRESSINGS)
BAG DRAIN URO-CYSTO SKYTR STRL (DRAIN) ×2 IMPLANT
BAG DRN UROCATH (DRAIN) ×1
BASKET STONE 1.7 NGAGE (UROLOGICAL SUPPLIES) IMPLANT
BASKET ZERO TIP NITINOL 2.4FR (BASKET) ×2 IMPLANT
BENZOIN TINCTURE PRP APPL 2/3 (GAUZE/BANDAGES/DRESSINGS) IMPLANT
BSKT STON RTRVL ZERO TP 2.4FR (BASKET) ×1
CATH URET 5FR 28IN OPEN ENDED (CATHETERS) IMPLANT
CLOTH BEACON ORANGE TIMEOUT ST (SAFETY) ×2 IMPLANT
FIBER LASER FLEXIVA 365 (UROLOGICAL SUPPLIES) IMPLANT
FIBER LASER TRAC TIP (UROLOGICAL SUPPLIES) ×2 IMPLANT
GLOVE BIO SURGEON STRL SZ7.5 (GLOVE) ×2 IMPLANT
GOWN STRL REUS W/TWL XL LVL3 (GOWN DISPOSABLE) ×2 IMPLANT
GUIDEWIRE STR DUAL SENSOR (WIRE) IMPLANT
GUIDEWIRE ZIPWRE .038 STRAIGHT (WIRE) ×2 IMPLANT
IV NS IRRIG 3000ML ARTHROMATIC (IV SOLUTION) ×4 IMPLANT
KIT TURNOVER CYSTO (KITS) ×2 IMPLANT
MANIFOLD NEPTUNE II (INSTRUMENTS) ×2 IMPLANT
NS IRRIG 500ML POUR BTL (IV SOLUTION) ×2 IMPLANT
PACK CYSTO (CUSTOM PROCEDURE TRAY) ×2 IMPLANT
STENT URET 6FRX26 CONTOUR (STENTS) ×1 IMPLANT
STRIP CLOSURE SKIN 1/2X4 (GAUZE/BANDAGES/DRESSINGS) IMPLANT
SYR 10ML LL (SYRINGE) ×2 IMPLANT
TUBE CONNECTING 12X1/4 (SUCTIONS) ×1 IMPLANT
TUBING UROLOGY SET (TUBING) ×2 IMPLANT

## 2020-05-25 NOTE — Anesthesia Preprocedure Evaluation (Addendum)
Anesthesia Evaluation  Patient identified by MRN, date of birth, ID band Patient awake    Reviewed: Allergy & Precautions, NPO status , Patient's Chart, lab work & pertinent test results  History of Anesthesia Complications Negative for: history of anesthetic complications  Airway Mallampati: II  TM Distance: >3 FB Neck ROM: Full    Dental  (+) Dental Advisory Given, Teeth Intact   Pulmonary sleep apnea and Continuous Positive Airway Pressure Ventilation ,    Pulmonary exam normal        Cardiovascular hypertension, Pt. on medications Normal cardiovascular exam     Neuro/Psych negative neurological ROS  negative psych ROS   GI/Hepatic Neg liver ROS, GERD  Controlled,  Endo/Other  negative endocrine ROS  Renal/GU negative Renal ROS     Musculoskeletal  (+) Arthritis ,   Abdominal   Peds  Hematology negative hematology ROS (+)   Anesthesia Other Findings Covid test negative   Reproductive/Obstetrics                            Anesthesia Physical Anesthesia Plan  ASA: II  Anesthesia Plan: General   Post-op Pain Management:    Induction: Intravenous  PONV Risk Score and Plan: 2 and Treatment may vary due to age or medical condition, Ondansetron, Dexamethasone and Midazolam  Airway Management Planned: LMA  Additional Equipment: None  Intra-op Plan:   Post-operative Plan: Extubation in OR  Informed Consent: I have reviewed the patients History and Physical, chart, labs and discussed the procedure including the risks, benefits and alternatives for the proposed anesthesia with the patient or authorized representative who has indicated his/her understanding and acceptance.     Dental advisory given  Plan Discussed with: CRNA and Anesthesiologist  Anesthesia Plan Comments:        Anesthesia Quick Evaluation

## 2020-05-25 NOTE — Op Note (Signed)
Operative Note  Preoperative diagnosis:  1.  1.3 cm right distal ureteral calculus  Postoperative diagnosis: 1.  1.3 cm right distal ureteral calculus  Procedure(s): 1.  Cystoscopy with right ureteroscopy, holmium laser lithotripsy and right JJ stent placement 2.  Right retrograde pyelogram with intraoperative interpretation of fluoroscopic imaging  Surgeon: [name], MD  Assistants:  None  Anesthesia:  General  Complications:  None  EBL: 5 mL  Specimens: 1.  Right ureteral stone fragments  Drains/Catheters: 1.  Right 6 French, 26 cm JJ stent without tether  Intraoperative findings:   1. Obstructing 1.3 cm right distal ureteral stone 2. Right retrograde pyelogram revealed a filling defect within the distal aspects of the right ureter, consistent with his obstructing stone.  The proximal right ureter in the right renal pelvis was uniformly dilated with no additional filling defects  Indication:  Bruce [name] is a 59 y.o. male with a residual 1.3 cm right distal ureteral stone despite ESWL on 03/05/2020.  He has been consented for the above procedures, voices understanding and wishes to proceed.  Description of procedure:  After informed consent was obtained, the patient was brought to the operating room and general LMA anesthesia was administered. The patient was then placed in the dorsolithotomy position and prepped and draped in the usual sterile fashion. A timeout was performed. A 23 French rigid cystoscope was then inserted into the urethral meatus and advanced into the bladder under direct vision. A complete bladder survey revealed no intravesical pathology.  A 5 French ureteral catheter was then inserted into the right ureteral orifice and a retrograde pyelogram was obtained, with the findings listed above.  A Glidewire was then used to intubate the lumen of the ureteral catheter and was advanced up to the right renal pelvis, under fluoroscopic guidance.  The  catheter was then removed, leaving the wire in place.  A semirigid ureteroscope was then advanced alongside the wire and into the distal aspects of the right ureter.  His large stone was immediately identified.  A 200 m holmium laser was then used to fracture the stone into numerous smaller pieces.  A 0 tip basket was then used to extract all stone fragments from the lumen of the right ureter.  The semirigid ureteroscope was then removed and the rigid cystoscope was then advanced back into the bladder over the wire.  A 6 French, 26 cm JJ stent was then placed over the wire and into good position within the right collecting system, confirming placement via fluoroscopy.  The patient's bladder was then drained and all stone fragments were removed.  He tolerated the procedure well and was transferred to the postanesthesia in stable condition.  Plan: Follow-up in 1 week for office cystoscopy and stent removal

## 2020-05-25 NOTE — Discharge Instructions (Signed)

## 2020-05-25 NOTE — Anesthesia Postprocedure Evaluation (Signed)
Anesthesia Post Note  Patient: Bruce [name]  Procedure(s) Performed: CYSTOSCOPY/RETROGRADE/URETEROSCOPY/HOLMIUM LASER/STENT PLACEMENT (Right Ureter)     Patient location during evaluation: PACU Anesthesia Type: General Level of consciousness: sedated and patient cooperative Pain management: pain level controlled Vital Signs Assessment: post-procedure vital signs reviewed and stable Respiratory status: spontaneous breathing Cardiovascular status: stable Anesthetic complications: no   No complications documented.  Last Vitals:  Vitals:   05/25/20 1705 05/25/20 1740  BP:  117/82  [name]: 75 73  Resp: 16 17  Temp:  36.4 C  SpO2: 95% 97%    Last Pain:  Vitals:   05/25/20 1740  TempSrc:   PainSc: 0-No pain                 Lewie Loron

## 2020-05-25 NOTE — Anesthesia Procedure Notes (Signed)
Procedure Name: LMA Insertion Date/Time: 05/25/2020 3:24 PM Performed by: Elyn Peers, CRNA Pre-anesthesia Checklist: Patient identified, Emergency Drugs available, Suction available, Patient being monitored and Timeout performed Patient Re-evaluated:Patient Re-evaluated prior to induction Oxygen Delivery Method: Circle system utilized Preoxygenation: Pre-oxygenation with 100% oxygen Induction Type: IV induction Ventilation: Mask ventilation without difficulty LMA: LMA inserted LMA Size: 5.0 Number of attempts: 1 Placement Confirmation: positive ETCO2 and breath sounds checked- equal and bilateral Tube secured with: Tape Dental Injury: Teeth and Oropharynx as per pre-operative assessment

## 2020-05-25 NOTE — H&P (Signed)
Urology Preoperative H&P   Chief Complaint: Right flank pain  History of Present Illness: Bruce [name] is a 59 y.o. male with a 1.3 cm right UVJ calculus causing intermittent episodes of sharp, nonradiating right flank/lower quadrant pain.  The patient is status post ESWL on 03/05/2020 to treat his distal stone.  He intially had resolution of his flank pain.  However, on 05/20/20 he had a recurrence of his right-sided flank pain despite passing multiple small stone fragments.  CT stone study performed in the emergency department on 05/20/2020 revealed a persistent 1 cm calculus in the distal aspects of the right ureter associated with moderate hydronephrosis.  He denies nausea/vomiting, fever/chills or gross hematuria.  He also denies interval UTIs.  Urine culture from 05/21/2020 showed no growth.   Past Medical History:  Diagnosis Date  . Arthritis    not diagnosed. hip  . GERD (gastroesophageal reflux disease)   . History of kidney stones   . Hypertension   . Sleep apnea    wears cpap. 4.5 is one setting    Past Surgical History:  Procedure Laterality Date  . EXTRACORPOREAL SHOCK WAVE LITHOTRIPSY Right 03/05/2020   Procedure: EXTRACORPOREAL SHOCK WAVE LITHOTRIPSY (ESWL);  Surgeon: Ceasar Mons, MD;  Location: [facility];  Service: Urology;  Laterality: Right;  . ruptured disc  2002   Dr. Marya Amsler YATES L5 MICRODIS  . TONSILLECTOMY     as child    Allergies: No Known Allergies  Family History  Problem Relation Age of Onset  . Heart attack Mother   . Hyperlipidemia Mother   . Hypertension Mother   . Heart attack Father   . Hyperlipidemia Father   . Colon cancer Maternal Grandmother   . Heart attack Paternal Grandmother   . Heart disease Paternal Grandmother   . Heart attack Paternal Grandfather   . Heart disease Paternal Grandfather     Social History:  reports that he has never smoked. He has never used smokeless tobacco. He reports that he does not  drink alcohol and does not use drugs.  ROS: A complete review of systems was performed.  All systems are negative except for pertinent findings as noted.  Physical Exam:  Vital signs in last 24 hours: Temp:  [97.4 F (36.3 C)] 97.4 F (36.3 C) (06/25 1316) Pulse Rate:  [84] 84 (06/25 1316) Resp:  [16] 16 (06/25 1316) BP: (115)/(80) 115/80 (06/25 1316) SpO2:  [96 %] 96 % (06/25 1316) Weight:  [102.1 kg] 102.1 kg (06/25 1316) Constitutional:  Alert and oriented, No acute distress Cardiovascular: Regular rate and rhythm, No JVD Respiratory: Normal respiratory effort, Lungs clear bilaterally GI: Abdomen is soft, nontender, nondistended, no abdominal masses GU: No CVA tenderness Lymphatic: No lymphadenopathy Neurologic: Grossly intact, no focal deficits Psychiatric: Normal mood and affect  Laboratory Data:  Recent Labs    05/25/20 1319  HGB 16.3  HCT 48.0    Recent Labs    05/25/20 1319  NA 141  K 4.3  CL 103  GLUCOSE 122*  BUN 23*  CREATININE 1.00     Results for orders placed or performed during the hospital encounter of 05/25/20 (from the past 24 hour(s))  I-STAT, chem 8     Status: Abnormal   Collection Time: 05/25/20  1:19 PM  Result Value Ref Range   Sodium 141 135 - 145 mmol/L   Potassium 4.3 3.5 - 5.1 mmol/L   Chloride 103 98 - 111 mmol/L   BUN 23 (H)  6 - 20 mg/dL   Creatinine, Ser 1.30 0.61 - 1.24 mg/dL   Glucose, Bld 865 (H) 70 - 99 mg/dL   Calcium, Ion 7.84 6.96 - 1.40 mmol/L   TCO2 28 22 - 32 mmol/L   Hemoglobin 16.3 13.0 - 17.0 g/dL   HCT 29.5 39 - 52 %   Recent Results (from the past 240 hour(s))  SARS CORONAVIRUS 2 (TAT 6-24 HRS) Nasopharyngeal Nasopharyngeal Swab     Status: None   Collection Time: 05/22/20  2:15 PM   Specimen: Nasopharyngeal Swab  Result Value Ref Range Status   SARS Coronavirus 2 NEGATIVE NEGATIVE Final    Comment: (NOTE) SARS-CoV-2 target nucleic acids are NOT DETECTED.  The SARS-CoV-2 RNA is generally detectable in  upper and lower respiratory specimens during the acute phase of infection. Negative results do not preclude SARS-CoV-2 infection, do not rule out co-infections with other pathogens, and should not be used as the sole basis for treatment or other patient management decisions. Negative results must be combined with clinical observations, patient history, and epidemiological information. The expected result is Negative.  Fact Sheet for Patients: HairSlick.no  Fact Sheet for Healthcare Providers: quierodirigir.com  This test is not yet approved or cleared by the [place] FDA and  has been authorized for detection and/or diagnosis of SARS-CoV-2 by FDA under an Emergency Use Authorization (EUA). This EUA will remain  in effect (meaning this test can be used) for the duration of the COVID-19 declaration under Se ction 564(b)(1) of the Act, 21 U.S.C. section 360bbb-3(b)(1), unless the authorization is terminated or revoked sooner.  Performed at [facility], [address], [place], [place] [address]     Renal Function: Recent Labs    05/20/20 1834 05/25/20 1319  CREATININE 1.19 1.00   Estimated Creatinine Clearance: 99 mL/min (by C-G formula based on SCr of 1 mg/dL).  Radiologic Imaging: No results found.  I independently reviewed the above imaging studies.  Assessment and Plan Bruce [name] is a 58 y.o. male with a 1.3 cm right UVJ calculus  The risks, benefits and alternatives of cystoscopy with RIGHT ureteroscopy, laser lithotripsy and ureteral stent placement was discussed the patient.  Risks included, but are not limited to: bleeding, urinary tract infection, ureteral injury/avulsion, ureteral stricture formation, retained stone fragments, the possibility that multiple surgeries may be required to treat the stone(s), MI, stroke, PE and the inherent risks of general anesthesia.  The patient voices understanding  and wishes to proceed.        [name], MD 05/25/2020, 1:34 PM  [facility] Pager: [phone]

## 2020-05-25 NOTE — Transfer of Care (Signed)
Immediate Anesthesia Transfer of Care Note  Patient: Bruce [name]  Procedure(s) Performed: CYSTOSCOPY/RETROGRADE/URETEROSCOPY/HOLMIUM LASER/STENT PLACEMENT (Right Ureter)  Patient Location: PACU  Anesthesia Type:General  Level of Consciousness: awake, alert  and patient cooperative  Airway & Oxygen Therapy: Patient Spontanous Breathing and Patient connected to nasal cannula oxygen  Post-op Assessment: Report given to RN and Post -op Vital signs reviewed and stable  Post vital signs: Reviewed and stable  Last Vitals:  Vitals Value Taken Time  BP 103/74 05/25/20 1620  Temp    [name] 90 05/25/20 1622  Resp 13 05/25/20 1622  SpO2 96 % 05/25/20 1622  Vitals shown include unvalidated device data.  Last Pain:  Vitals:   05/25/20 1316  TempSrc: Oral      Patients Stated Pain Goal: 5 (05/25/20 1316)  Complications: No complications documented.

## 2020-05-28 ENCOUNTER — Encounter (HOSPITAL_BASED_OUTPATIENT_CLINIC_OR_DEPARTMENT_OTHER): Payer: Self-pay | Admitting: Urology

## 2020-06-01 DIAGNOSIS — N201 Calculus of ureter: Secondary | ICD-10-CM | POA: Diagnosis not present

## 2020-06-13 ENCOUNTER — Ambulatory Visit (INDEPENDENT_AMBULATORY_CARE_PROVIDER_SITE_OTHER): Payer: BC Managed Care – PPO | Admitting: Orthopaedic Surgery

## 2020-06-13 ENCOUNTER — Other Ambulatory Visit: Payer: Self-pay

## 2020-06-13 ENCOUNTER — Ambulatory Visit: Payer: Self-pay

## 2020-06-13 DIAGNOSIS — M1612 Unilateral primary osteoarthritis, left hip: Secondary | ICD-10-CM | POA: Diagnosis not present

## 2020-06-13 DIAGNOSIS — M545 Low back pain, unspecified: Secondary | ICD-10-CM

## 2020-06-13 DIAGNOSIS — M25559 Pain in unspecified hip: Secondary | ICD-10-CM

## 2020-06-13 NOTE — Progress Notes (Signed)
Office Visit Note   Patient: Bruce [name]           Date of Birth: September 14, 1961           MRN: [id number] Visit Date: 06/13/2020              Requested by: Daisy Floro, MD [address] [place],  [place] [address] PCP: Daisy Floro, MD   Assessment & Plan: Visit Diagnoses:  1. Hip pain   2. Low back pain, unspecified back pain laterality, unspecified chronicity, unspecified whether sciatica present   3. Unilateral primary osteoarthritis, left hip     Plan: At this point, a MRI of the left hip is warranted to truly assess the degree of cartilage loss based on his clinical exam findings and the failure of conservative treatment over several years including an intra-articular steroid injection.  He is worked on activity modification and taking anti-inflammatories and work on hip strengthening.  All this is not helped.  The MRI at this point is medically necessary to help determine the next treatment steps.  Follow-Up Instructions: Return in about 2 weeks (around 06/27/2020).   Orders:  Orders Placed This Encounter  Procedures  . XR HIP UNILAT W OR W/O PELVIS 1V LEFT  . XR Lumbar Spine 2-3 Views   No orders of the defined types were placed in this encounter.     Procedures: No procedures performed   Clinical Data: No additional findings.   Subjective: Chief Complaint  Patient presents with  . Left Hip - Pain  The patient is well-known to me.  We first saw him in late 2018 and then in 2019 he had a steroid injection in his left hip under direct fluoroscopy.  This was in February 2019.  He hurts in the groin and he states that the left hip injection really did not do much for him.  He also had a microdiscectomy in the lower lumbar spine at L5-S1 about 19 years ago.  He does report stiffness in his lower back but no radicular symptoms going down his legs.  Most of his pain is in the left hip around the groin and there is stiffness in that area.  It is starting to  affect his posture and is bothering him.  At this point it can detrimentally affect his quality of life, his mobility and his activities daily living.  He said no other acute change in medical status other than he did have to deal with a kidney stone earlier this year.  Things have worsened with his hip since then from where he describes the head the torque on his hip a little bit due to the kidney stone and the procedure that required him to have that stone removed.  HPI  Review of Systems He currently denies any headache, chest pain, shortness of breath, fever, chills, nausea, vomiting  Objective: Vital Signs: There were no vitals taken for this visit.  Physical Exam He is alert and orient x3 and in no acute distress Ortho Exam Examination of his left hip shows significant stiffness with internal and external rotation of the left hip and there is pain in the groin as well.  He has negative straight leg raise bilaterally.  He has normal muscle tone and normal sensation of both lower extremities. Specialty Comments:  No specialty comments available.  Imaging: XR HIP UNILAT W OR W/O PELVIS 1V LEFT  Result Date: 06/13/2020 A low AP pel fights vis and lateral  of the left hip shows worsening arthritic changes with periarticular osteophytes around the left hip joint.  XR Lumbar Spine 2-3 Views  Result Date: 06/13/2020 2 views of the lumbar spine showed degenerative disc disease with the space narrowing at L5-S1.  The remainder of the spine exam appears normal.    PMFS History: Patient Active Problem List   Diagnosis Date Noted  . Unilateral primary osteoarthritis, left hip 06/13/2020  . Trochanteric bursitis, left hip 01/11/2018  . Abnormal glucose level 10/14/2016  . Gastroesophageal reflux disease without esophagitis 10/14/2016  . Essential hypertension 12/13/2015  . OSA (obstructive sleep apnea) 08/29/2015  . Hyperlipidemia with target LDL less than 100 04/26/2013  . Family  history of premature CAD 04/26/2013  . Snoring 04/26/2013   Past Medical History:  Diagnosis Date  . Arthritis    not diagnosed. hip  . GERD (gastroesophageal reflux disease)   . History of kidney stones   . Hypertension   . Sleep apnea    wears cpap. 4.5 is one setting    Family History  Problem Relation Age of Onset  . Heart attack Mother   . Hyperlipidemia Mother   . Hypertension Mother   . Heart attack Father   . Hyperlipidemia Father   . Colon cancer Maternal Grandmother   . Heart attack Paternal Grandmother   . Heart disease Paternal Grandmother   . Heart attack Paternal Grandfather   . Heart disease Paternal Grandfather     Past Surgical History:  Procedure Laterality Date  . CYSTOSCOPY/URETEROSCOPY/HOLMIUM LASER/STENT PLACEMENT Right 05/25/2020   Procedure: CYSTOSCOPY/RETROGRADE/URETEROSCOPY/HOLMIUM LASER/STENT PLACEMENT;  Surgeon: Rene Paci, MD;  Location: [facility];  Service: Urology;  Laterality: Right;  . EXTRACORPOREAL SHOCK WAVE LITHOTRIPSY Right 03/05/2020   Procedure: EXTRACORPOREAL SHOCK WAVE LITHOTRIPSY (ESWL);  Surgeon: Rene Paci, MD;  Location: [facility];  Service: Urology;  Laterality: Right;  . ruptured disc  2002   Dr. Tammy Sours YATES L5 MICRODIS  . TONSILLECTOMY     as child   Social History   Occupational History  . Not on file  Tobacco Use  . Smoking status: Never Smoker  . Smokeless tobacco: Never Used  Vaping Use  . Vaping Use: Never used  Substance and Sexual Activity  . Alcohol use: Never    Alcohol/week: 0.0 standard drinks    Comment: rarely has a drink  . Drug use: Never  . Sexual activity: Yes

## 2020-06-14 ENCOUNTER — Other Ambulatory Visit: Payer: Self-pay

## 2020-06-14 DIAGNOSIS — M25559 Pain in unspecified hip: Secondary | ICD-10-CM

## 2020-06-14 DIAGNOSIS — G4733 Obstructive sleep apnea (adult) (pediatric): Secondary | ICD-10-CM | POA: Diagnosis not present

## 2020-06-20 ENCOUNTER — Ambulatory Visit
Admission: RE | Admit: 2020-06-20 | Discharge: 2020-06-20 | Disposition: A | Discharge status: Home / Self Care | Payer: BC Managed Care – PPO | Source: Ambulatory Visit | Attending: Orthopaedic Surgery | Admitting: Orthopaedic Surgery

## 2020-06-20 ENCOUNTER — Other Ambulatory Visit: Payer: Self-pay

## 2020-06-20 DIAGNOSIS — N433 Hydrocele, unspecified: Secondary | ICD-10-CM | POA: Diagnosis not present

## 2020-06-20 DIAGNOSIS — G8929 Other chronic pain: Secondary | ICD-10-CM | POA: Diagnosis not present

## 2020-06-20 DIAGNOSIS — M25559 Pain in unspecified hip: Secondary | ICD-10-CM

## 2020-06-20 DIAGNOSIS — M25452 Effusion, left hip: Secondary | ICD-10-CM | POA: Diagnosis not present

## 2020-06-20 DIAGNOSIS — M1612 Unilateral primary osteoarthritis, left hip: Secondary | ICD-10-CM | POA: Diagnosis not present

## 2020-06-20 IMAGING — MR MR HIP*L* W/O CM
5 series · 40 of 40 positions shown · non-contrast
Comparison: X-ray [DATE]

CLINICAL DATA: Chronic left hip pain.  No known injury.

EXAM:
MR OF THE LEFT HIP WITHOUT CONTRAST
TECHNIQUE: Multiplanar, multisequence MR imaging was performed. No intravenous
contrast was administered.

[Series 3: T1 · coronal · 4.0mm · 1.19mm/px · 11 of 46 slices shown]
[im 1/46]
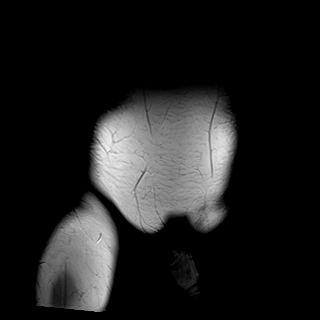
[im 5/46]
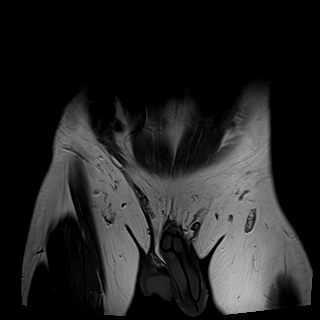
[im 10/46]
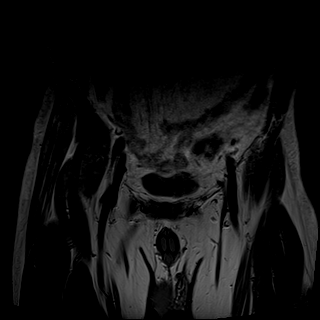
[im 14/46]
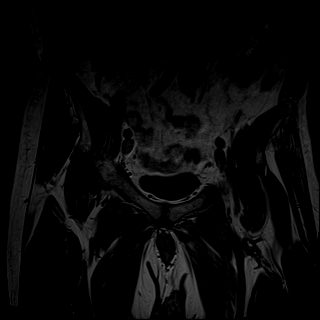
[im 19/46]
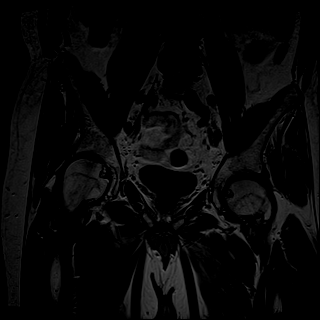
[im 23/46]
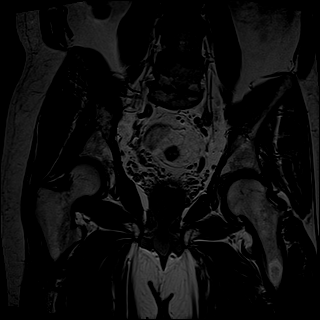
[im 28/46]
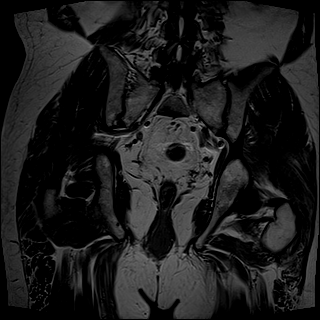
[im 32/46]
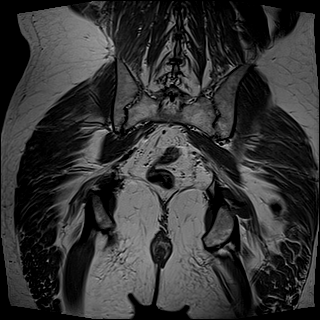
[im 37/46]
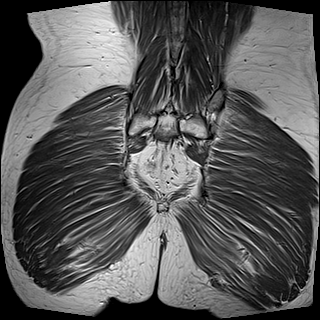
[im 41/46]
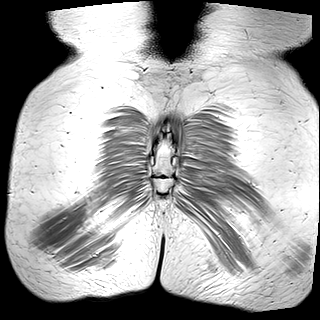
[im 46/46]
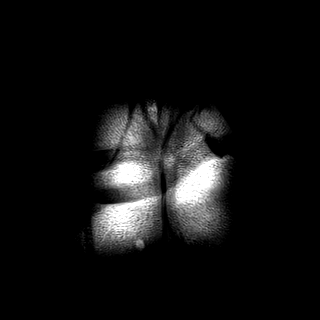

[Series 4: T2 fat-sat · coronal · 4.0mm · 1.19mm/px · 10 of 46 slices shown (1 of 2)]
[im 1/46]
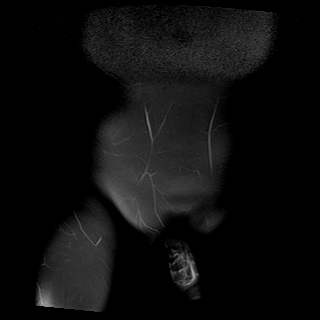
[im 6/46]
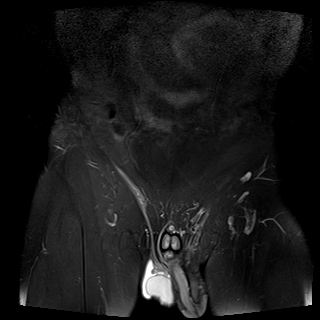
[im 11/46]
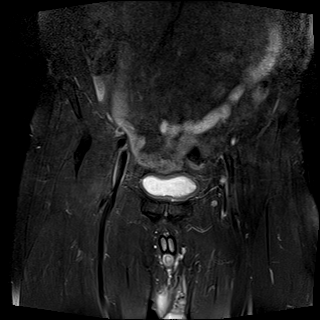
[im 16/46]
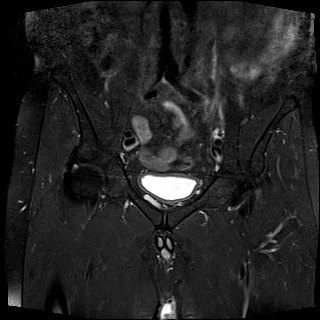
[im 21/46]
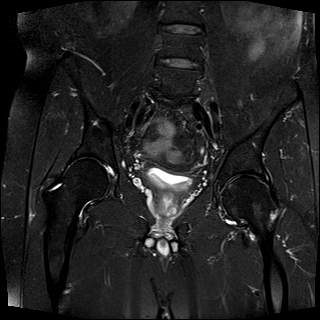
[im 26/46]
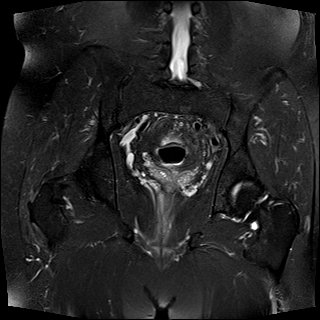
[im 31/46]
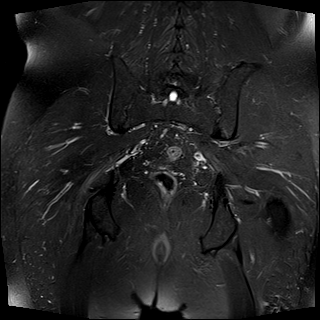
[im 36/46]
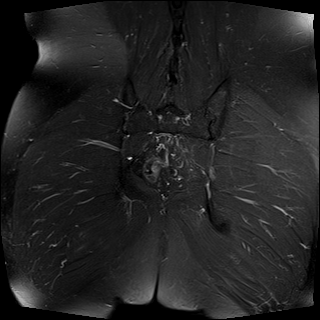
[im 41/46]
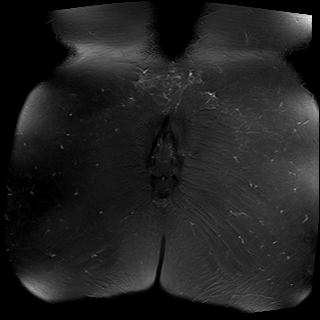
[im 46/46]
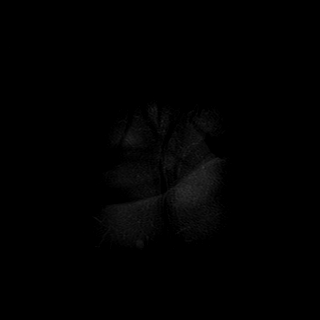

[Series 5: T2 fat-sat · axial · 4.0mm · 0.37mm/px · z∈[-94,+45]mm · 6 of 29 slices shown (2 of 2)]
[im 1/29]
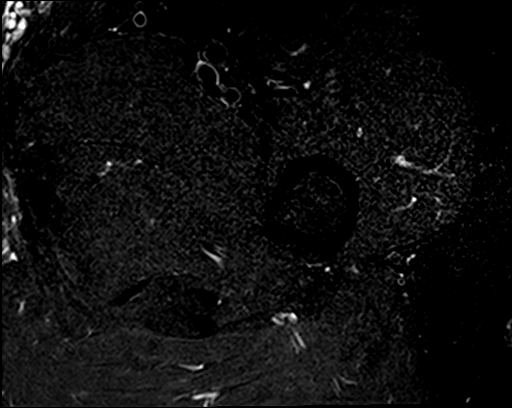
[im 6/29]
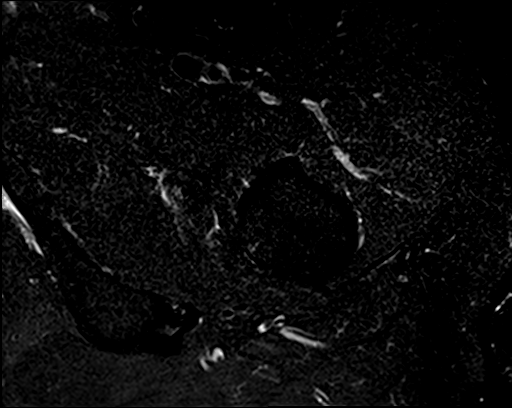
[im 12/29]
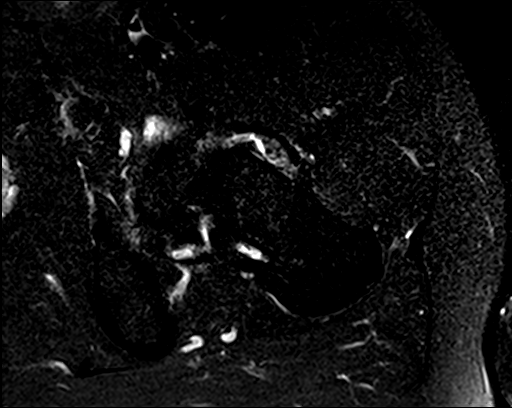
[im 17/29]
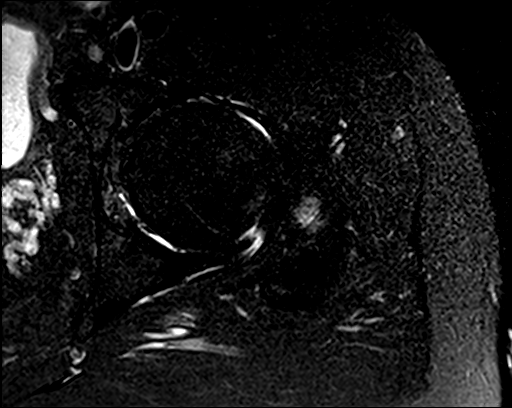
[im 23/29]
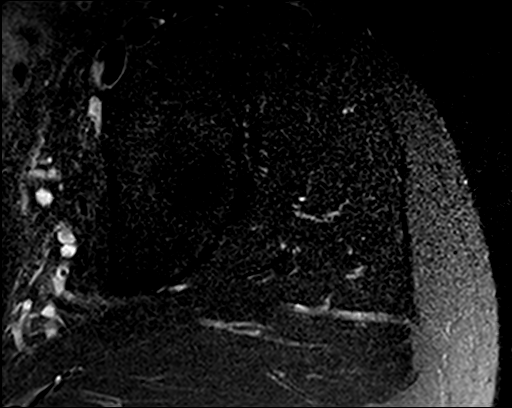
[im 29/29]
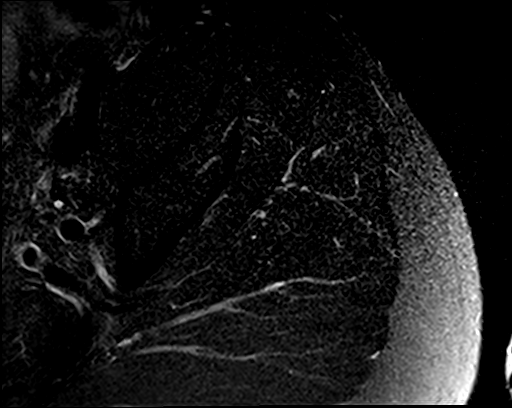

[Series 6: PD fat-sat · sagittal · 4.0mm · 0.70mm/px · 7 of 31 slices shown (1 of 2)]
[im 1/31]
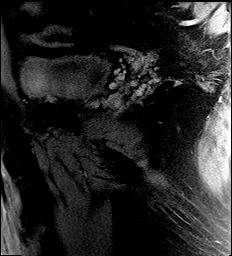
[im 6/31]
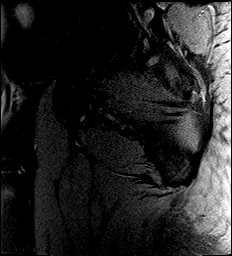
[im 11/31]
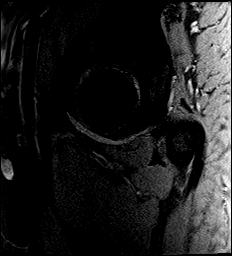
[im 16/31]
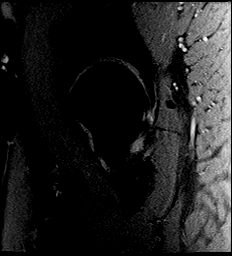
[im 21/31]
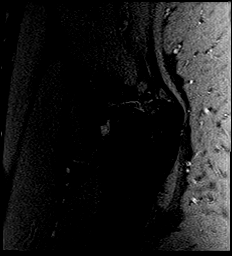
[im 26/31]
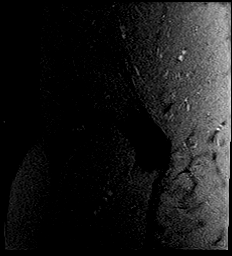
[im 31/31]
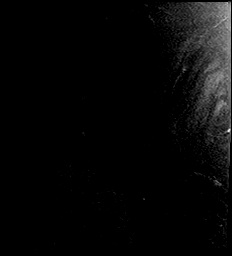

[Series 8: PD fat-sat · coronal · 4.0mm · 0.94mm/px · 6 of 27 slices shown (2 of 2)]
[im 1/27]
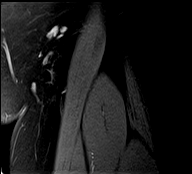
[im 6/27]
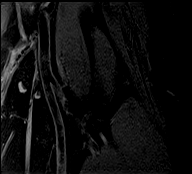
[im 11/27]
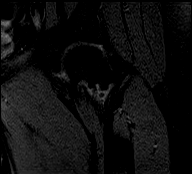
[im 16/27]
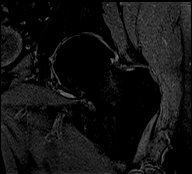
[im 21/27]
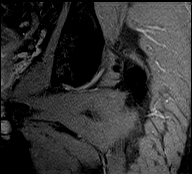
[im 27/27]
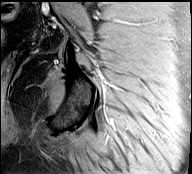

[40 of 40 positions shown; findings below may reference images not displayed]

FINDINGS: Bones: No acute fracture. No dislocation. No femoral head avascular
necrosis. Prominent near circumferential humeral head osteophytosis,
left greater than right. Bilateral SI joints are intact with minimal
arthropathy. Discogenic endplate marrow changes at L5-S1 where there
is disc height loss. Pubic symphysis intact.

Articular cartilage and labrum

Articular cartilage: Moderate diffuse chondral thinning and surface
irregularity of the left hip joint. Minimal subchondral marrow
signal changes within the superior acetabulum. No subchondral cystic
changes.

Labrum:  Degenerative tearing of the superior labrum.

Joint or bursal effusion

Joint effusion: Small joint effusion containing multiple small low
signal intensity loose bodies.

Bursae: No abnormal bursal fluid collection.

Muscles and tendons

Muscles and tendons: The gluteal, hamstring, iliopsoas, rectus
femoris, and adductor tendons appear intact without tear or
significant tendinosis. Muscle bulk and signal intensity is within
normal limits.

Other findings

Miscellaneous: Small right hydrocele. Mild scattered sigmoid
diverticulosis. No soft tissue fluid collection or hematoma. No
inguinal lymphadenopathy.
IMPRESSION: 1. Moderate osteoarthritis of the left hip.
2. Small left hip joint effusion containing multiple small
intra-articular loose bodies.
3. Small right hydrocele.

## 2020-06-27 ENCOUNTER — Encounter: Payer: Self-pay | Admitting: Orthopaedic Surgery

## 2020-06-27 ENCOUNTER — Ambulatory Visit (INDEPENDENT_AMBULATORY_CARE_PROVIDER_SITE_OTHER): Payer: BC Managed Care – PPO | Admitting: Orthopaedic Surgery

## 2020-06-27 DIAGNOSIS — M1612 Unilateral primary osteoarthritis, left hip: Secondary | ICD-10-CM | POA: Diagnosis not present

## 2020-06-27 NOTE — Progress Notes (Signed)
The patient comes in today to go over the MRI of his left hip. He says since I saw him last he is been working on hip flexion exercises and having Voltaren gel at least twice a day placed around his hip. He does feel better.  On exam his left hip does have stiffness with internal and external rotation that his right hip does not have. There is some pain associated with extremes of rotation of that hip.  MRIs reviewed with him he does show moderate arthritic changes in the left hip. There is thinning of the articular cartilage on the femoral head and subchondral edema in the roof of the acetabulum. There is also degenerative labral tearing.  At this point we did talk about hip replacement surgery. We can still consider conservative treatment since he is not hurting on a daily basis. He would like to try an intra-articular steroid injection in the left hip joint. He has had this before around 2018. We will work on setting this up on Dr. Alvester Morin schedule for steroid injection in his left hip under direct fluoroscopy. All questions and concerns were answered and addressed. If his symptoms worsen enough for him to warrant hip replacement surgery, he will let us know. Follow-up with me is as needed.

## 2020-06-28 NOTE — Addendum Note (Signed)
Addended byPrescott Parma on: 06/28/2020 04:20 PM   Modules accepted: Orders

## 2020-07-04 DIAGNOSIS — Z23 Encounter for immunization: Secondary | ICD-10-CM | POA: Diagnosis not present

## 2020-07-12 ENCOUNTER — Telehealth: Payer: Self-pay | Admitting: Physical Medicine and Rehabilitation

## 2020-07-12 ENCOUNTER — Other Ambulatory Visit: Payer: Self-pay

## 2020-07-12 ENCOUNTER — Ambulatory Visit: Payer: Self-pay

## 2020-07-12 ENCOUNTER — Ambulatory Visit (INDEPENDENT_AMBULATORY_CARE_PROVIDER_SITE_OTHER): Payer: BC Managed Care – PPO | Admitting: Physical Medicine and Rehabilitation

## 2020-07-12 ENCOUNTER — Encounter: Payer: Self-pay | Admitting: Physical Medicine and Rehabilitation

## 2020-07-12 DIAGNOSIS — M25552 Pain in left hip: Secondary | ICD-10-CM | POA: Diagnosis not present

## 2020-07-12 DIAGNOSIS — M545 Low back pain: Secondary | ICD-10-CM

## 2020-07-12 DIAGNOSIS — M1612 Unilateral primary osteoarthritis, left hip: Secondary | ICD-10-CM

## 2020-07-12 DIAGNOSIS — N201 Calculus of ureter: Secondary | ICD-10-CM | POA: Diagnosis not present

## 2020-07-12 DIAGNOSIS — M961 Postlaminectomy syndrome, not elsewhere classified: Secondary | ICD-10-CM

## 2020-07-12 DIAGNOSIS — N401 Enlarged prostate with lower urinary tract symptoms: Secondary | ICD-10-CM | POA: Diagnosis not present

## 2020-07-12 DIAGNOSIS — R3913 Splitting of urinary stream: Secondary | ICD-10-CM | POA: Diagnosis not present

## 2020-07-12 DIAGNOSIS — G8929 Other chronic pain: Secondary | ICD-10-CM

## 2020-07-12 MED ORDER — BUPIVACAINE HCL 0.25 % IJ SOLN
4.0000 mL | INTRAMUSCULAR | Status: AC | PRN
  Administered 2020-07-12: 4 mL via INTRA_ARTICULAR

## 2020-07-12 MED ORDER — TRIAMCINOLONE ACETONIDE 40 MG/ML IJ SUSP
60.0000 mg | INTRAMUSCULAR | Status: AC | PRN
  Administered 2020-07-12: 60 mg via INTRA_ARTICULAR

## 2020-07-12 NOTE — Telephone Encounter (Signed)
Pt called wanting to know if he had to have someone with him for after his injection? Pt would like a CB

## 2020-07-12 NOTE — Progress Notes (Signed)
Numeric Pain Rating Scale and Functional Assessment Average Pain 3   In the last MONTH (on 0-10 scale) has pain interfered with the following?  1. General activity like being  able to carry out your everyday physical activities such as walking, climbing stairs, carrying groceries, or moving a chair?  Rating(3)   +Driver, -BT, -Dye Allergies.s .

## 2020-07-12 NOTE — Telephone Encounter (Signed)
Called patient to advise that he does not have to have anyone with him for a hip injection.

## 2020-07-12 NOTE — Progress Notes (Signed)
Bruce [name] - 59 y.o. male MRN [id number]  Date of birth: 09/10/1961  Office Visit Note: Visit Date: 07/12/2020 PCP: Daisy Floro, MD Referred by: Daisy Floro, MD  Subjective: Chief Complaint  Patient presents with  . Left Hip - Pain   HPI: Bruce [name] is a 59 y.o. male who comes in today For evaluation management of low back and left hip and thigh pain.  Patient is followed by Dr. Doneen Poisson.  He is known to have moderate arthritis of the left hip.  I actually saw him in 2018 and completed diagnostic hip injection at the time that did give him some relief.  He has pain now that is constant but not severe worse with walking and moving better at rest and sitting.  He has no pain on the right.  No paresthesias or pain down the legs.  He has a history of prior lumbar laminectomy by Dr. Annell Greening some years ago.  Dr. Magnus Ivan has been evaluating him and he has been doing some home exercises and they had discussed possible hip replacement versus injection treatment.  He had a lot of questions today about physical therapy type exercises and strengthening and stretching which we went over at length.  He also had questions today about lumbar spine issues with potential source of pain for the hip.  He is having pain in referral to the groin at times more discomfort than pain.  Does not really limit a lot of what he does is just very aggravating.  Review of Systems  Musculoskeletal: Positive for back pain and joint pain.  All other systems reviewed and are negative.  Otherwise per HPI.  Assessment & Plan: Visit Diagnoses:  1. Pain in left hip   2. Chronic bilateral low back pain without sciatica   3. Post laminectomy syndrome   4. Unilateral primary osteoarthritis, left hip     Plan: Findings:  Left hip pain with referral pattern consistent with a hip and some history of back pain with history of lumbar disc herniation and laminectomy decompression.  We will complete  diagnostic hip injection today with fluoroscopic guidance.  We did talk about stretching of the hip flexors as well as strengthening of the back and pelvis.  He may benefit from skilled physical therapy for just a few sessions at some point.  He will continue to follow-up with Dr. Magnus Ivan for his hip.  Always happy to see him for his back if this should not help.    Meds & Orders: No orders of the defined types were placed in this encounter.   Orders Placed This Encounter  Procedures  . Large Joint Inj: L hip joint  . XR C-ARM NO REPORT    Follow-up: Return if symptoms worsen or fail to improve.   Procedures: Large Joint Inj: L hip joint on 07/12/2020 11:38 AM Indications: diagnostic evaluation and pain Details: 22 G 3.5 in needle, fluoroscopy-guided anterior approach  Arthrogram: No  Medications: 4 mL bupivacaine 0.25 %; 60 mg triamcinolone acetonide 40 MG/ML Outcome: tolerated well, no immediate complications  There was excellent flow of contrast producing a partial arthrogram of the hip. The patient did have relief of symptoms during the anesthetic phase of the injection. Procedure, treatment alternatives, risks and benefits explained, specific risks discussed. Consent was given by the patient. Immediately prior to procedure a time out was called to verify the correct patient, procedure, equipment, support staff and site/side marked as required. Patient was  prepped and draped in the usual sterile fashion.      No notes on file   Clinical History: No specialty comments available.   He reports that he has never smoked. He has never used smokeless tobacco. No results for input(s): HGBA1C, LABURIC in the last 8760 hours.  Objective:  VS:  HT:    WT:   BMI:     BP:   HR: bpm  TEMP: ( )  RESP:  Physical Exam Constitutional:      General: He is not in acute distress.    Appearance: Normal appearance. He is not ill-appearing.  HENT:     Head: Normocephalic and atraumatic.      Right Ear: External ear normal.     Left Ear: External ear normal.  Eyes:     Extraocular Movements: Extraocular movements intact.  Cardiovascular:     Rate and Rhythm: Normal rate.     Pulses: Normal pulses.  Abdominal:     General: There is no distension.     Palpations: Abdomen is soft.  Musculoskeletal:        General: No tenderness or signs of injury.     Right lower leg: No edema.     Left lower leg: No edema.     Comments: Patient has good distal strength without clonus.  He has no pain over the greater trochanters.  He has good movement with more ability to move the right hip than the left hip.  He has pain at end ranges with some limited motion.    Skin:    Findings: No erythema or rash.  Neurological:     General: No focal deficit present.     Mental Status: He is alert and oriented to person, place, and time.     Sensory: No sensory deficit.     Motor: No weakness or abnormal muscle tone.     Coordination: Coordination normal.  Psychiatric:        Mood and Affect: Mood normal.        Behavior: Behavior normal.     Ortho Exam  Imaging: XR C-ARM NO REPORT  Result Date: 07/12/2020 Please see Notes tab for imaging impression.   Past Medical/Family/Surgical/Social History: Medications & Allergies reviewed per EMR, new medications updated. Patient Active Problem List   Diagnosis Date Noted  . Unilateral primary osteoarthritis, left hip 06/13/2020  . Trochanteric bursitis, left hip 01/11/2018  . Abnormal glucose level 10/14/2016  . Gastroesophageal reflux disease without esophagitis 10/14/2016  . Essential hypertension 12/13/2015  . OSA (obstructive sleep apnea) 08/29/2015  . Hyperlipidemia with target LDL less than 100 04/26/2013  . Family history of premature CAD 04/26/2013  . Snoring 04/26/2013   Past Medical History:  Diagnosis Date  . Arthritis    not diagnosed. hip  . GERD (gastroesophageal reflux disease)   . History of kidney stones   .  Hypertension   . Sleep apnea    wears cpap. 4.5 is one setting   Family History  Problem Relation Age of Onset  . Heart attack Mother   . Hyperlipidemia Mother   . Hypertension Mother   . Heart attack Father   . Hyperlipidemia Father   . Colon cancer Maternal Grandmother   . Heart attack Paternal Grandmother   . Heart disease Paternal Grandmother   . Heart attack Paternal Grandfather   . Heart disease Paternal Grandfather    Past Surgical History:  Procedure Laterality Date  . CYSTOSCOPY/URETEROSCOPY/HOLMIUM LASER/STENT PLACEMENT Right 05/25/2020  Procedure: CYSTOSCOPY/RETROGRADE/URETEROSCOPY/HOLMIUM LASER/STENT PLACEMENT;  Surgeon: Rene Paci, MD;  Location: [facility];  Service: Urology;  Laterality: Right;  . EXTRACORPOREAL SHOCK WAVE LITHOTRIPSY Right 03/05/2020   Procedure: EXTRACORPOREAL SHOCK WAVE LITHOTRIPSY (ESWL);  Surgeon: Rene Paci, MD;  Location: [facility];  Service: Urology;  Laterality: Right;  . ruptured disc  2002   Dr. Tammy Sours YATES L5 MICRODIS  . TONSILLECTOMY     as child   Social History   Occupational History  . Not on file  Tobacco Use  . Smoking status: Never Smoker  . Smokeless tobacco: Never Used  Vaping Use  . Vaping Use: Never used  Substance and Sexual Activity  . Alcohol use: Never    Alcohol/week: 0.0 standard drinks    Comment: rarely has a drink  . Drug use: Never  . Sexual activity: Yes

## 2020-08-09 ENCOUNTER — Other Ambulatory Visit: Payer: Self-pay | Admitting: Cardiovascular Disease

## 2020-08-24 ENCOUNTER — Telehealth: Payer: Self-pay | Admitting: Physical Medicine and Rehabilitation

## 2020-08-24 ENCOUNTER — Other Ambulatory Visit: Payer: Self-pay | Admitting: Physical Medicine and Rehabilitation

## 2020-08-24 MED ORDER — MELOXICAM 15 MG PO TABS: 15.0000 mg | ORAL_TABLET | Freq: Every day | ORAL | 0 refills | Status: DC

## 2020-08-24 NOTE — Telephone Encounter (Signed)
Patient wanted to see you for low back pain. I have scheduled an OV. He is requesting a prescription for meloxicam. Pharmacy is correct. Please advise.

## 2020-08-24 NOTE — Telephone Encounter (Signed)
Pt called stating he would like to get an appt with Dr.Newton please  [phone]

## 2020-08-24 NOTE — Progress Notes (Signed)
Ok meloxicam until we see him

## 2020-08-24 NOTE — Telephone Encounter (Signed)
History of left hip injections. Last was 07/12/20. Please advise.

## 2020-08-24 NOTE — Telephone Encounter (Signed)
Bruce [name] was only about 1 month of relief. If it helped greatly but short lived really should see Dr. Magnus Ivan. Alternatively could try one more but if only helpful short term will need f/up.  If no help then Dr. Magnus Ivan

## 2020-08-24 NOTE — Telephone Encounter (Signed)
Ok for 1 time rx until we see him, does take some opioid

## 2020-09-04 ENCOUNTER — Other Ambulatory Visit: Payer: Self-pay | Admitting: Cardiovascular Disease

## 2020-09-11 ENCOUNTER — Ambulatory Visit (INDEPENDENT_AMBULATORY_CARE_PROVIDER_SITE_OTHER): Payer: BC Managed Care – PPO | Admitting: Physical Medicine and Rehabilitation

## 2020-09-11 ENCOUNTER — Encounter: Payer: Self-pay | Admitting: Physical Medicine and Rehabilitation

## 2020-09-11 ENCOUNTER — Other Ambulatory Visit: Payer: Self-pay

## 2020-09-11 VITALS — BP 138/88 | HR 67

## 2020-09-11 DIAGNOSIS — M961 Postlaminectomy syndrome, not elsewhere classified: Secondary | ICD-10-CM

## 2020-09-11 DIAGNOSIS — M5137 Other intervertebral disc degeneration, lumbosacral region: Secondary | ICD-10-CM | POA: Diagnosis not present

## 2020-09-11 DIAGNOSIS — M5416 Radiculopathy, lumbar region: Secondary | ICD-10-CM | POA: Diagnosis not present

## 2020-09-11 MED ORDER — MELOXICAM 15 MG PO TABS
15.0000 mg | ORAL_TABLET | Freq: Every day | ORAL | 0 refills | Status: DC
Start: 2020-09-11 — End: 2020-09-17

## 2020-09-11 NOTE — Progress Notes (Signed)
[name] - 59 y.o. male MRN [id number]  Date of birth: 07/06/1961  Office Visit Note: Visit Date: 09/11/2020 PCP: Daisy Floro, MD Referred by: Daisy Floro, MD  Subjective: Chief Complaint  Patient presents with  . Lower Back - Pain  . Right Leg - Pain   HPI: Bruce [name] is a 59 y.o. male who comes in today For evaluation management of chronic history of intermittent back pain and hip pain but now with 2 months of significantly worsening low back right buttock and posterior lateral hip pain down to the foot.  By way of quick review he is typically followed by Dr. Doneen Poisson from an orthopedic standpoint in our office.  We have seen him in August for left intra-articular hip injection which did help him quite a bit.  He did have MRI of the left hip showing arthritic changes of the hip.  He is doing fairly well with that.  He states that sometimes right after that he began having significant right lower back pain into the buttock and this progressed to go down the legs.  He is not really had that in the past the much going down the legs that far.  He denies any groin pain on the right.  He denies any specific injury or trauma.  He has had renal history of lumbar surgery.  This was performed by Dr. Annell Greening in 2002.  This appears to be an L5-S1 microdiscectomy.  He did call us at that point we did prescribe meloxicam which she has taken over the last month and he does feel like the meloxicam helped while he was taking it and it made it pretty bearable.  He has had a history of some opioid medication in the past for pain and arthritic changes of the hip.  He endorses some feeling of paresthesia on the right.  No focal weakness no red flag complaints.  He has had imaging of the lumbar spine which is reviewed today.  This was obtained in the past by Dr. Magnus Ivan.  He rates his pain on average as a 5 out of 10 but it can intermittently be quite severe.  It is somewhat  intermittent dull and aching it is worse with walking and standing and better with sitting and at rest.  Review of Systems  Musculoskeletal: Positive for back pain and joint pain.  All other systems reviewed and are negative.  Otherwise per HPI.  Assessment & Plan: Visit Diagnoses:  1. Lumbar radiculopathy   2. Post laminectomy syndrome   3. Other intervertebral disc degeneration, lumbosacral region     Plan: Findings:  Chronic history of back pain off and on status post lumbar microdiscectomy at L5-S1 many years ago with x-ray evidence of degenerative height loss at L5-S1 with some early arthritic change at L5-S1 and L4-5.  No listhesis.  Now with 2 months of progressive right hip buttock and leg pain consistent more with an L5 or S1 and perhaps more really more S1 radiculopathy radiculitis.  No red flag complaints but with lack of prior imaging and prior surgery do recommend MRI of the lumbar spine to identify hopefully source of pain which may be a lateral disc herniation at L5-S1 versus stenosis.  Discussed this at length with him and would look future at potential for injection and regrouping with physical therapy.  He has had therapy in the past recently for his hip and has been doing home exercises.  We did  speak with him and show him how to do neural flossing and he'll start that as well.  I did refill his meloxicam 1 time.  We'll see him back after the MRI scan.    Meds & Orders:  Meds ordered this encounter  Medications  . meloxicam (MOBIC) 15 MG tablet    Sig: Take 1 tablet (15 mg total) by mouth daily. Take with food    Dispense:  30 tablet    Refill:  0    Orders Placed This Encounter  Procedures  . MR LUMBAR SPINE WO CONTRAST    Follow-up: Return for MRI review after completion.   Procedures: No procedures performed  No notes on file   Clinical History: 06/13/2020 2 view lumbar spine x-ray 2 views of the lumbar spine showed degenerative disc disease with the  space  narrowing at L5-S1. The remainder of the spine exam appears normal. Doneen Poisson, MD   He reports that he has never smoked. He has never used smokeless tobacco. No results for input(s): HGBA1C, LABURIC in the last 8760 hours.  Objective:  VS:  HT:    WT:   BMI:     BP:138/88  HR:67bpm  TEMP: ( )  RESP:  Physical Exam Constitutional:      General: He is not in acute distress.    Appearance: Normal appearance. He is not ill-appearing.  HENT:     Head: Normocephalic and atraumatic.     Right Ear: External ear normal.     Left Ear: External ear normal.  Eyes:     Extraocular Movements: Extraocular movements intact.  Cardiovascular:     Rate and Rhythm: Normal rate.     Pulses: Normal pulses.  Abdominal:     General: There is no distension.     Palpations: Abdomen is soft.  Musculoskeletal:        General: Tenderness present. No signs of injury.     Cervical back: Normal range of motion and neck supple.     Right lower leg: No edema.     Left lower leg: No edema.     Comments: Patient has good distal strength without clonus. Positive right slump test. Subjective S1 right paresthesia. No pain with hip rotation.  Some pain with hip rotation on the left but not on the right.  He does have some pain with facet loading.  Skin:    Findings: No erythema or rash.  Neurological:     General: No focal deficit present.     Mental Status: He is alert and oriented to person, place, and time.     Sensory: Sensory deficit present.     Motor: No weakness or abnormal muscle tone.     Coordination: Coordination normal.     Gait: Gait normal.  Psychiatric:        Mood and Affect: Mood normal.        Behavior: Behavior normal.     Ortho Exam  Imaging: No results found.  Past Medical/Family/Surgical/Social History: Medications & Allergies reviewed per EMR, new medications updated. Patient Active Problem List   Diagnosis Date Noted  . Unilateral primary osteoarthritis, left hip  06/13/2020  . Trochanteric bursitis, left hip 01/11/2018  . Abnormal glucose level 10/14/2016  . Gastroesophageal reflux disease without esophagitis 10/14/2016  . Essential hypertension 12/13/2015  . OSA (obstructive sleep apnea) 08/29/2015  . Hyperlipidemia with target LDL less than 100 04/26/2013  . Family history of premature CAD 04/26/2013  . Snoring 04/26/2013  Past Medical History:  Diagnosis Date  . Arthritis    not diagnosed. hip  . GERD (gastroesophageal reflux disease)   . History of kidney stones   . Hypertension   . Sleep apnea    wears cpap. 4.5 is one setting   Family History  Problem Relation Age of Onset  . Heart attack Mother   . Hyperlipidemia Mother   . Hypertension Mother   . Heart attack Father   . Hyperlipidemia Father   . Colon cancer Maternal Grandmother   . Heart attack Paternal Grandmother   . Heart disease Paternal Grandmother   . Heart attack Paternal Grandfather   . Heart disease Paternal Grandfather    Past Surgical History:  Procedure Laterality Date  . CYSTOSCOPY/URETEROSCOPY/HOLMIUM LASER/STENT PLACEMENT Right 05/25/2020   Procedure: CYSTOSCOPY/RETROGRADE/URETEROSCOPY/HOLMIUM LASER/STENT PLACEMENT;  Surgeon: Rene Paci, MD;  Location: [facility];  Service: Urology;  Laterality: Right;  . EXTRACORPOREAL SHOCK WAVE LITHOTRIPSY Right 03/05/2020   Procedure: EXTRACORPOREAL SHOCK WAVE LITHOTRIPSY (ESWL);  Surgeon: Rene Paci, MD;  Location: [facility];  Service: Urology;  Laterality: Right;  . ruptured disc  2002   Dr. Tammy Sours YATES L5 MICRODIS  . TONSILLECTOMY     as child   Social History   Occupational History  . Not on file  Tobacco Use  . Smoking status: Never Smoker  . Smokeless tobacco: Never Used  Vaping Use  . Vaping Use: Never used  Substance and Sexual Activity  . Alcohol use: Never    Alcohol/week: 0.0 standard drinks    Comment: rarely has a drink  . Drug use:  Never  . Sexual activity: Yes

## 2020-09-11 NOTE — Progress Notes (Signed)
Low back, right buttock, lateral hip, and posterior leg pain to foot. Better with Meloxicam. Numeric Pain Rating Scale and Functional Assessment Average Pain 5 Pain Right Now 1 My pain is intermittent, dull and aching Pain is worse with: walking and standing Pain improves with: rest   In the last MONTH (on 0-10 scale) has pain interfered with the following?  1. General activity like being  able to carry out your everyday physical activities such as walking, climbing stairs, carrying groceries, or moving a chair?  Rating(5)  2. Relation with others like being able to carry out your usual social activities and roles such as  activities at home, at work and in your community. Rating(6)  3. Enjoyment of life such that you have  been bothered by emotional problems such as feeling anxious, depressed or irritable?  Rating(2)

## 2020-09-17 ENCOUNTER — Telehealth: Payer: Self-pay | Admitting: Physical Medicine and Rehabilitation

## 2020-09-17 ENCOUNTER — Other Ambulatory Visit: Payer: Self-pay | Admitting: Physical Medicine and Rehabilitation

## 2020-09-17 MED ORDER — MELOXICAM 15 MG PO TABS
15.0000 mg | ORAL_TABLET | Freq: Every day | ORAL | 0 refills | Status: DC
Start: 2020-09-17 — End: 2021-01-03

## 2020-09-17 NOTE — Telephone Encounter (Signed)
Patient called. He would like a refill on meloxicxam. His call back number is [phone]

## 2020-09-17 NOTE — Telephone Encounter (Signed)
Ok done

## 2020-09-17 NOTE — Telephone Encounter (Signed)
Please advise 

## 2020-09-17 NOTE — Telephone Encounter (Signed)
Called patient to advise. Call ended after one ring.

## 2020-09-28 DIAGNOSIS — G4733 Obstructive sleep apnea (adult) (pediatric): Secondary | ICD-10-CM | POA: Diagnosis not present

## 2020-09-30 ENCOUNTER — Ambulatory Visit
Admission: RE | Admit: 2020-09-30 | Discharge: 2020-09-30 | Disposition: A | Discharge status: Home / Self Care | Payer: BC Managed Care – PPO | Source: Ambulatory Visit | Attending: Physical Medicine and Rehabilitation | Admitting: Physical Medicine and Rehabilitation

## 2020-09-30 DIAGNOSIS — M545 Low back pain, unspecified: Secondary | ICD-10-CM | POA: Diagnosis not present

## 2020-09-30 DIAGNOSIS — M48061 Spinal stenosis, lumbar region without neurogenic claudication: Secondary | ICD-10-CM | POA: Diagnosis not present

## 2020-09-30 DIAGNOSIS — M5416 Radiculopathy, lumbar region: Secondary | ICD-10-CM

## 2020-09-30 DIAGNOSIS — M961 Postlaminectomy syndrome, not elsewhere classified: Secondary | ICD-10-CM

## 2020-09-30 IMAGING — MR MR LUMBAR SPINE W/O CM
4 of 5 series · 21 of 48 positions shown · non-contrast
Comparison: Prior radiograph from [DATE].

CLINICAL DATA: Initial evaluation for 4 week history of right lower
back pain with right buttock pain extending into the leg.

EXAM:
MRI LUMBAR SPINE WITHOUT CONTRAST
TECHNIQUE: Multiplanar, multisequence MR imaging of the lumbar spine was
performed. No intravenous contrast was administered.

[Series 5: T2 · sagittal · 4.0mm · 0.73mm/px · 6 of 13 slices shown (1 of 2)]
[im 1/13]
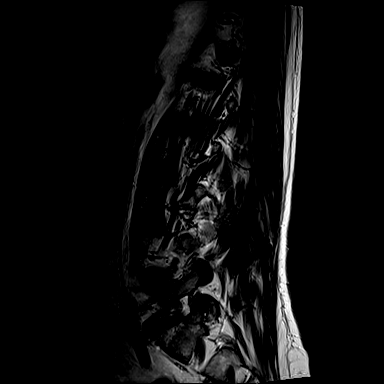
[im 3/13]
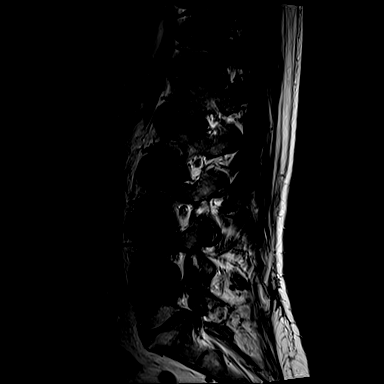
[im 5/13]
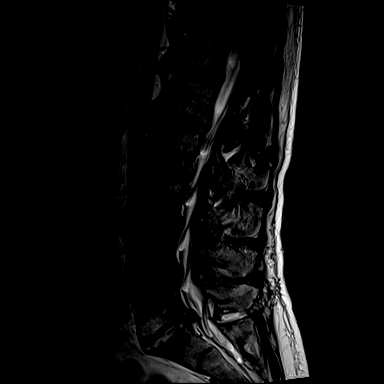
[im 8/13]
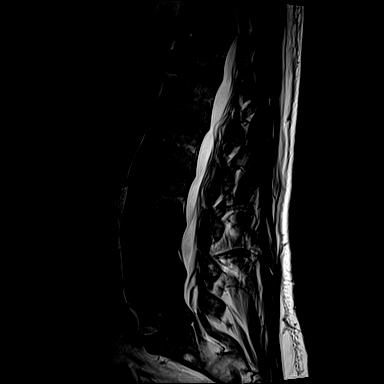
[im 10/13]
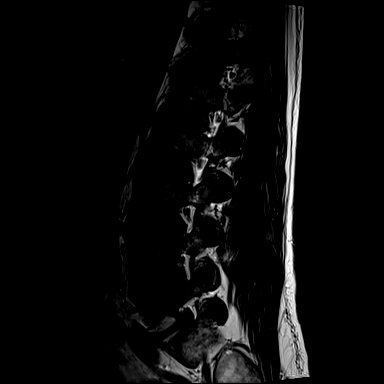
[im 13/13]
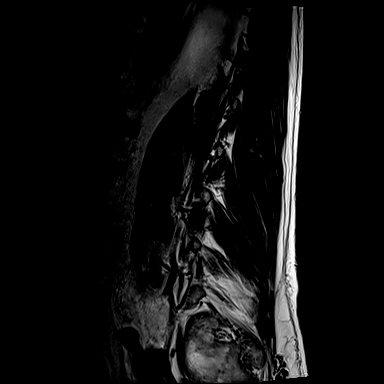

[Series 6: T1 · sagittal · 4.0mm · 0.73mm/px · 3 of 13 slices shown (1 of 2)]
[im 1/13]
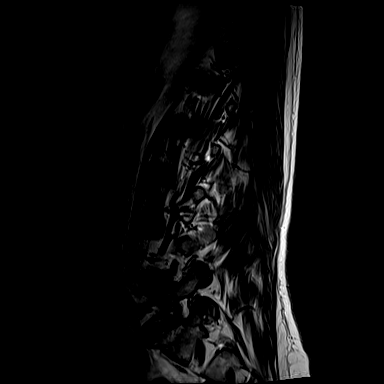
[im 7/13]
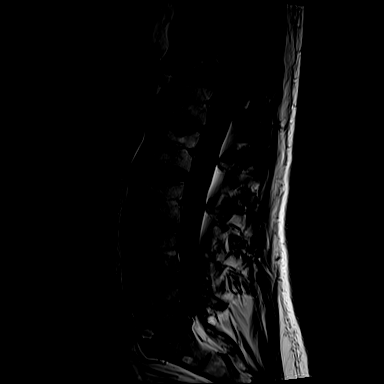
[im 13/13]
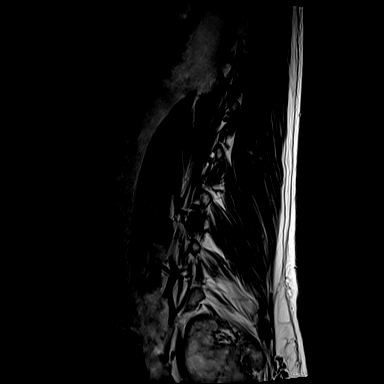

[Series 10: T2 · axial · 4.0mm · 0.35mm/px · z∈[-85,+98]mm · 9 of 40 slices shown (2 of 2)]
[im 3/40]
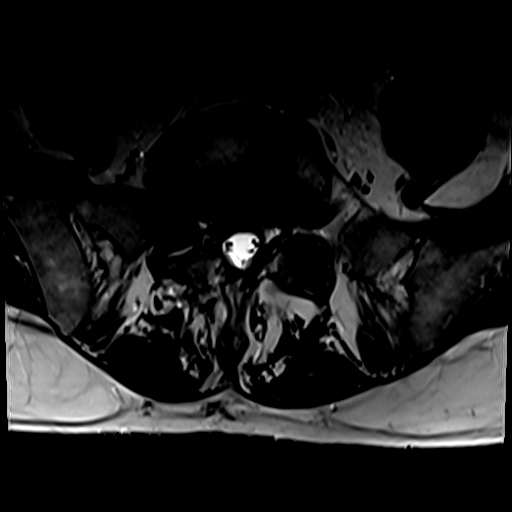
[im 6/40]
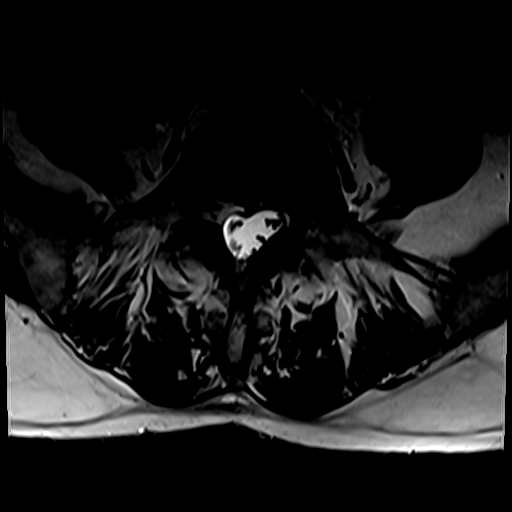
[im 8/40]
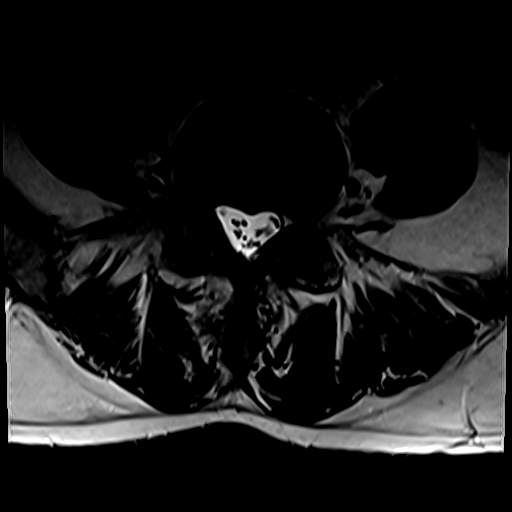
[im 14/40]
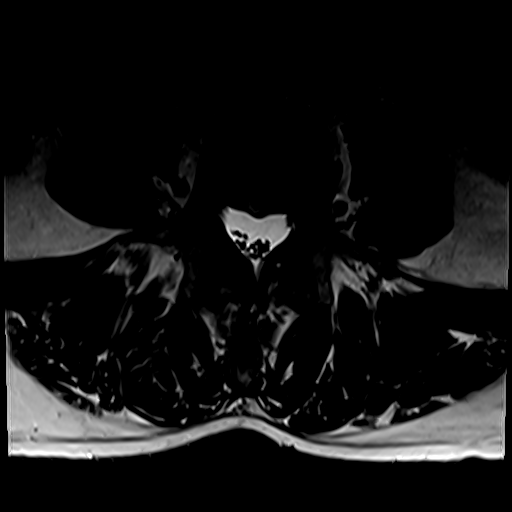
[im 19/40]
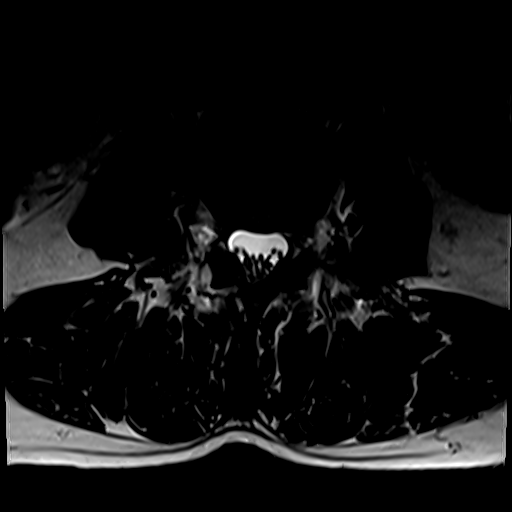
[im 21/40]
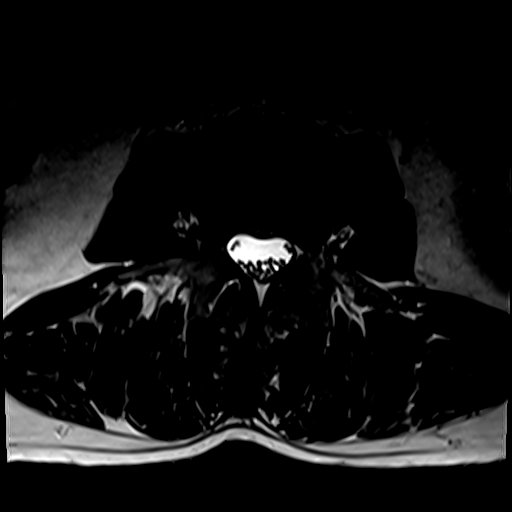
[im 24/40]
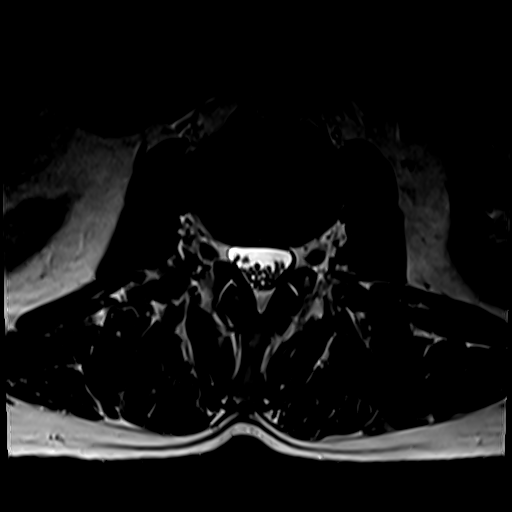
[im 29/40]
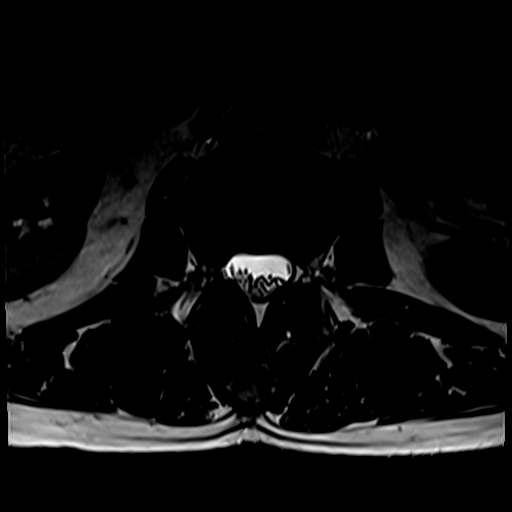
[im 34/40]
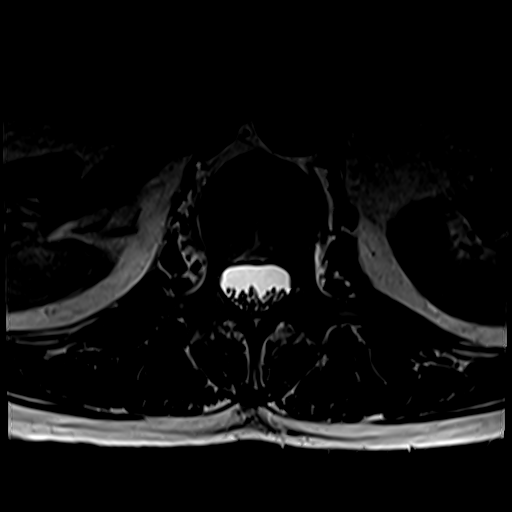

[Series 13: T1 · axial · 4.0mm · 0.35mm/px · z∈[-70,+98]mm · 3 of 40 slices shown (2 of 2)]
[im 6/40]
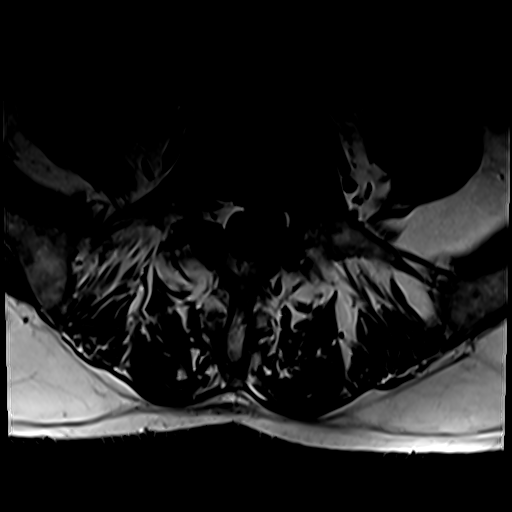
[im 21/40]
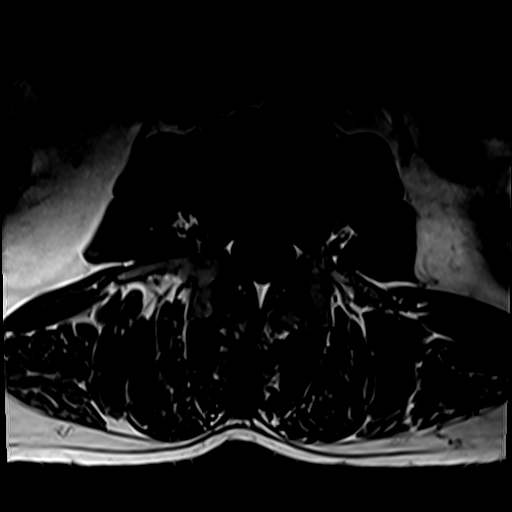
[im 34/40]
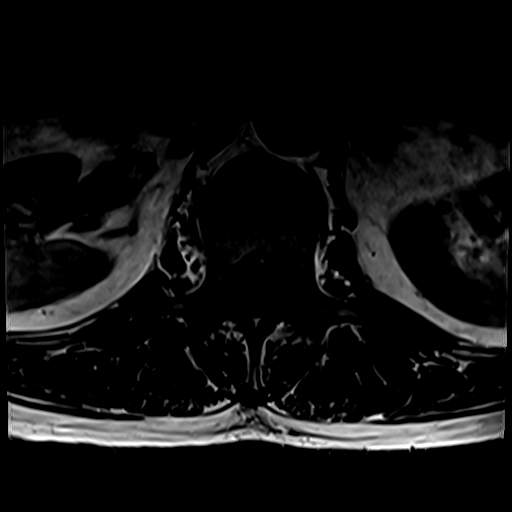

[21 of 48 positions shown; findings below may reference images not displayed]

FINDINGS: Segmentation: Standard. Lowest well-formed disc space labeled the
L5-S1 level.

Alignment: Mild dextroscoliosis. Trace 2 mm retrolisthesis of L5 on
S1. Additional trace retrolisthesis of L1 on L2 and L2 on L3.

Vertebrae: Vertebral body height maintained without acute or chronic
fracture. Bone marrow signal intensity somewhat heterogeneous but
within normal limits. Few scattered benign hemangiomata noted. No
worrisome osseous lesions. Discogenic reactive endplate changes
present about the L5-S1 interspace. No abnormal marrow edema.

Conus medullaris and cauda equina: Conus extends to the T12-L1
level. Conus and cauda equina appear normal.

Paraspinal and other soft tissues: Paraspinous soft tissues within
normal limits. Visualized visceral structures are normal.

Disc levels:

T11-12: Disc bulge with disc desiccation. No significant spinal
stenosis. Foramina remain patent.

T12-L1: Disc desiccation. Right foraminal to extraforaminal disc
protrusion with reactive endplate spurring (series 13, image 4).
This closely approximates the exiting nerve root without frank
neural impingement. No significant spinal stenosis. Mild right
foraminal narrowing.

L1-2: Mild disc bulge with disc desiccation. Shallow right foraminal
to extraforaminal disc protrusion closely approximates the exiting
right L1 nerve root without impingement or displacement (series 13,
image 11). No spinal stenosis. Foramina remain patent.

L2-3: Disc desiccation with mild disc bulge. Mild facet hypertrophy.
No canal or foraminal stenosis.

L3-4: Disc desiccation with mild disc bulge. Superimposed small left
foraminal disc protrusion (series 10, image 25). This closely
approximates the exiting left L3 nerve root without impingement or
displacement. Mild facet hypertrophy. No significant spinal
stenosis. Foramina remain patent.

L4-5: Disc desiccation with mild disc bulge. Mild reactive endplate
change. Superimposed small biforaminal to extraforaminal disc
protrusions (series 10, image 32). Associated annular fissures.
Moderate bilateral facet hypertrophy. No significant spinal
stenosis. Foramina remain patent.

L5-S1: Degenerative intervertebral disc space narrowing with disc
desiccation and diffuse disc bulge. Reactive endplate spurring.
Superimposed right subarticular disc extrusion with inferior
migration, extending into the right lateral recess and impinging
upon the descending right S1 nerve root (series 13, image 38). Mild
to moderate bilateral facet hypertrophy. Resultant moderate to
severe right lateral recess stenosis. Central canal remains patent.
No significant foraminal encroachment.
IMPRESSION: 1. Right subarticular disc extrusion with inferior migration at
L5-S1, impinging upon the descending right S1 nerve root.
2. Small biforaminal to extraforaminal disc protrusions at L4-5
without neural impingement.
3. Right foraminal to extraforaminal disc protrusions at T12-L1 and
L1-2, closely approximating and potentially irritating the exiting
right T12 and L1 nerve roots respectively.
4. Small left foraminal disc protrusion at L3-4, potentially
affecting the exiting left L3 nerve root.

## 2020-10-02 ENCOUNTER — Telehealth: Payer: Self-pay | Admitting: Physical Medicine and Rehabilitation

## 2020-10-02 NOTE — Telephone Encounter (Signed)
Left message #1 to schedule Right L5 and S1 tf esi f/up MRI

## 2020-10-02 NOTE — Telephone Encounter (Signed)
See previous message

## 2020-10-02 NOTE — Telephone Encounter (Signed)
Patient called returning a call to [facility] for appt. Please call patient at [phone].

## 2020-10-02 NOTE — Telephone Encounter (Signed)
-----   Message from Tyrell Antonio, MD sent at 10/01/2020  1:01 PM EDT ----- Regarding: needs f/u Inj post MRI Right L5 and S1 tf esi f/up MRI

## 2020-10-02 NOTE — Telephone Encounter (Signed)
Is auth needed for the ESIs? Scheduled for 11/15

## 2020-10-03 NOTE — Telephone Encounter (Signed)
Pt not req Auth#. 

## 2020-10-04 ENCOUNTER — Other Ambulatory Visit: Payer: Self-pay | Admitting: Cardiovascular Disease

## 2020-10-04 NOTE — Telephone Encounter (Signed)
*  STAT* If patient is at the pharmacy, call can be transferred to refill team.   1. Which medications need to be refilled? (please list name of each medication and dose if known) olmesartan (BENICAR) 40 MG tablet  2. Which pharmacy/location (including street and city if local pharmacy) is medication to be sent to? [id number] - [place], [place] - [address]  3. Do they need a 30 day or 90 day supply? 90 day supply  Patient scheduled an appointment for 11/13/20 with Judy Pimple.

## 2020-10-05 ENCOUNTER — Other Ambulatory Visit: Payer: Self-pay | Admitting: Cardiovascular Disease

## 2020-10-15 ENCOUNTER — Encounter: Payer: Self-pay | Admitting: Physical Medicine and Rehabilitation

## 2020-10-15 ENCOUNTER — Other Ambulatory Visit: Payer: Self-pay

## 2020-10-15 ENCOUNTER — Ambulatory Visit: Payer: Self-pay

## 2020-10-15 ENCOUNTER — Ambulatory Visit (INDEPENDENT_AMBULATORY_CARE_PROVIDER_SITE_OTHER): Payer: BC Managed Care – PPO | Admitting: Physical Medicine and Rehabilitation

## 2020-10-15 VITALS — BP 130/91 | HR 73

## 2020-10-15 DIAGNOSIS — M5416 Radiculopathy, lumbar region: Secondary | ICD-10-CM | POA: Diagnosis not present

## 2020-10-15 MED ORDER — METHYLPREDNISOLONE ACETATE 80 MG/ML IJ SUSP
80.0000 mg | Freq: Once | INTRAMUSCULAR | Status: AC
  Administered 2020-10-15: 80 mg

## 2020-10-15 NOTE — Progress Notes (Signed)
Pt state lower back and buttocks pain that travels to his right hip down the back of his right leg to the ankle. Pt state standing fo a long time makes the pain worse. Pt state sitting down and taking pain meds helps ease the pain.  Numeric Pain Rating Scale and Functional Assessment Average Pain 5   In the last MONTH (on 0-10 scale) has pain interfered with the following?  1. General activity like being  able to carry out your everyday physical activities such as walking, climbing stairs, carrying groceries, or moving a chair?  Rating(8)   +Driver, -BT, -Dye Allergies.

## 2020-10-16 NOTE — Procedures (Signed)
S1 Lumbosacral Transforaminal Epidural Steroid Injection - Sub-Pedicular Approach with Fluoroscopic Guidance   Patient: Bruce [name]      Date of Birth: 03-Dec-1960 MRN: [id number] PCP: Daisy Floro, MD      Visit Date: 10/15/2020   Universal Protocol:    Date/Time: 11/16/215:53 AM  Consent Given By: the patient  Position:  PRONE  Additional Comments: Vital signs were monitored before and after the procedure. Patient was prepped and draped in the usual sterile fashion. The correct patient, procedure, and site was verified.   Injection Procedure Details:  Procedure Site One Meds Administered:  Meds ordered this encounter  Medications  . methylPREDNISolone acetate (DEPO-MEDROL) injection 80 mg    Laterality: Right  Location/Site:  S1 Foramen   Needle size: 22 ga.  Needle type: Spinal  Needle Placement: Transforaminal  Findings:   -Comments: Excellent flow of contrast along the nerve and into the epidural space.  Epidurogram: Contrast epidurogram showed no nerve root cut off or restricted flow pattern.  Procedure Details: After squaring off the sacral end-plate to get a true AP view, the C-arm was positioned so that the best possible view of the S1 foramen was visualized. The soft tissues overlying this structure were infiltrated with 2-3 ml. of 1% Lidocaine without Epinephrine.    The spinal needle was inserted toward the target using a "trajectory" view along the fluoroscope beam.  Under AP and lateral visualization, the needle was advanced so it did not puncture dura. Biplanar projections were used to confirm position. Aspiration was confirmed to be negative for CSF and/or blood. A 1-2 ml. volume of Isovue-250 was injected and flow of contrast was noted at each level. Radiographs were obtained for documentation purposes.   After attaining the desired flow of contrast documented above, a 0.5 to 1.0 ml test dose of 0.25% Marcaine was injected into each respective  transforaminal space.  The patient was observed for 90 seconds post injection.  After no sensory deficits were reported, and normal lower extremity motor function was noted,   the above injectate was administered so that equal amounts of the injectate were placed at each foramen (level) into the transforaminal epidural space.   Additional Comments:  The patient tolerated the procedure well Dressing: Band-Aid with 2 x 2 sterile gauze    Post-procedure details: Patient was observed during the procedure. Post-procedure instructions were reviewed.  Patient left the clinic in stable condition.

## 2020-10-16 NOTE — Progress Notes (Signed)
Bruce [name] - 59 y.o. male MRN [id number]  Date of birth: 1961/11/01  Office Visit Note: Visit Date: 10/15/2020 PCP: Daisy Floro, MD Referred by: Daisy Floro, MD  Subjective: Chief Complaint  Patient presents with  . Lower Back - Pain  . Right Foot - Pain  . Right Ankle - Pain   HPI:  Bruce [name] is a 59 y.o. male who comes in today at the request of Dr. Naaman Plummer for planned Right L4-L5 and S1-2 Lumbar epidural steroid injection with fluoroscopic guidance.  The patient has failed conservative care including home exercise, medications, time and activity modification.  This injection will be diagnostic and hopefully therapeutic.  Please see requesting physician notes for further details and justification.  MRI reviewed with images and spine model.  MRI reviewed in the note below.  Patient with history of prior L5-S1 microdiscectomy in 2002 by Dr. Annell Greening.  Now with recurrent or essentially new right L5-S1 disc extrusion with symptomatic S1 radiculopathy.  Depending on relief with diagnostic injection will follow up with Dr. Prince Solian.   ROS Otherwise per HPI.  Assessment & Plan: Visit Diagnoses:  1. Lumbar radiculopathy     Plan: No additional findings.   Meds & Orders:  Meds ordered this encounter  Medications  . methylPREDNISolone acetate (DEPO-MEDROL) injection 80 mg    Orders Placed This Encounter  Procedures  . XR C-ARM NO REPORT  . Epidural Steroid injection    Follow-up: Return if symptoms worsen or fail to improve.   Procedures: No procedures performed  S1 Lumbosacral Transforaminal Epidural Steroid Injection - Sub-Pedicular Approach with Fluoroscopic Guidance   Patient: RYE [name]      Date of Birth: Feb 17, 1961 MRN: [id number] PCP: Daisy Floro, MD      Visit Date: 10/15/2020   Universal Protocol:    Date/Time: 11/16/215:53 AM  Consent Given By: the patient  Position:  PRONE  Additional Comments: Vital signs were  monitored before and after the procedure. Patient was prepped and draped in the usual sterile fashion. The correct patient, procedure, and site was verified.   Injection Procedure Details:  Procedure Site One Meds Administered:  Meds ordered this encounter  Medications  . methylPREDNISolone acetate (DEPO-MEDROL) injection 80 mg    Laterality: Right  Location/Site:  S1 Foramen   Needle size: 22 ga.  Needle type: Spinal  Needle Placement: Transforaminal  Findings:   -Comments: Excellent flow of contrast along the nerve and into the epidural space.  Epidurogram: Contrast epidurogram showed no nerve root cut off or restricted flow pattern.  Procedure Details: After squaring off the sacral end-plate to get a true AP view, the C-arm was positioned so that the best possible view of the S1 foramen was visualized. The soft tissues overlying this structure were infiltrated with 2-3 ml. of 1% Lidocaine without Epinephrine.    The spinal needle was inserted toward the target using a "trajectory" view along the fluoroscope beam.  Under AP and lateral visualization, the needle was advanced so it did not puncture dura. Biplanar projections were used to confirm position. Aspiration was confirmed to be negative for CSF and/or blood. A 1-2 ml. volume of Isovue-250 was injected and flow of contrast was noted at each level. Radiographs were obtained for documentation purposes.   After attaining the desired flow of contrast documented above, a 0.5 to 1.0 ml test dose of 0.25% Marcaine was injected into each respective transforaminal space.  The patient was observed  for 90 seconds post injection.  After no sensory deficits were reported, and normal lower extremity motor function was noted,   the above injectate was administered so that equal amounts of the injectate were placed at each foramen (level) into the transforaminal epidural space.   Additional Comments:  The patient tolerated the  procedure well Dressing: Band-Aid with 2 x 2 sterile gauze    Post-procedure details: Patient was observed during the procedure. Post-procedure instructions were reviewed.  Patient left the clinic in stable condition.     Clinical History: MRI LUMBAR SPINE WITHOUT CONTRAST  TECHNIQUE: Multiplanar, multisequence MR imaging of the lumbar spine was performed. No intravenous contrast was administered.  COMPARISON:  Prior radiograph from 05/14/2020.  FINDINGS: Segmentation: Standard. Lowest well-formed disc space labeled the L5-S1 level.  Alignment: Mild dextroscoliosis. Trace 2 mm retrolisthesis of L5 on S1. Additional trace retrolisthesis of L1 on L2 and L2 on L3.  Vertebrae: Vertebral body height maintained without acute or chronic fracture. Bone marrow signal intensity somewhat heterogeneous but within normal limits. Few scattered benign hemangiomata noted. No worrisome osseous lesions. Discogenic reactive endplate changes present about the L5-S1 interspace. No abnormal marrow edema.  Conus medullaris and cauda equina: Conus extends to the T12-L1 level. Conus and cauda equina appear normal.  Paraspinal and other soft tissues: Paraspinous soft tissues within normal limits. Visualized visceral structures are normal.  Disc levels:  T11-12: Disc bulge with disc desiccation. No significant spinal stenosis. Foramina remain patent.  T12-L1: Disc desiccation. Right foraminal to extraforaminal disc protrusion with reactive endplate spurring (series 13, image 4). This closely approximates the exiting nerve root without frank neural impingement. No significant spinal stenosis. Mild right foraminal narrowing.  L1-2: Mild disc bulge with disc desiccation. Shallow right foraminal to extraforaminal disc protrusion closely approximates the exiting right L1 nerve root without impingement or displacement (series 13, image 11). No spinal stenosis. Foramina remain  patent.  L2-3: Disc desiccation with mild disc bulge. Mild facet hypertrophy. No canal or foraminal stenosis.  L3-4: Disc desiccation with mild disc bulge. Superimposed small left foraminal disc protrusion (series 10, image 25). This closely approximates the exiting left L3 nerve root without impingement or displacement. Mild facet hypertrophy. No significant spinal stenosis. Foramina remain patent.  L4-5: Disc desiccation with mild disc bulge. Mild reactive endplate change. Superimposed small biforaminal to extraforaminal disc protrusions (series 10, image 32). Associated annular fissures. Moderate bilateral facet hypertrophy. No significant spinal stenosis. Foramina remain patent.  L5-S1: Degenerative intervertebral disc space narrowing with disc desiccation and diffuse disc bulge. Reactive endplate spurring. Superimposed right subarticular disc extrusion with inferior migration, extending into the right lateral recess and impinging upon the descending right S1 nerve root (series 13, image 38). Mild to moderate bilateral facet hypertrophy. Resultant moderate to severe right lateral recess stenosis. Central canal remains patent. No significant foraminal encroachment.  IMPRESSION: 1. Right subarticular disc extrusion with inferior migration at L5-S1, impinging upon the descending right S1 nerve root. 2. Small biforaminal to extraforaminal disc protrusions at L4-5 without neural impingement. 3. Right foraminal to extraforaminal disc protrusions at T12-L1 and L1-2, closely approximating and potentially irritating the exiting right T12 and L1 nerve roots respectively. 4. Small left foraminal disc protrusion at L3-4, potentially affecting the exiting left L3 nerve root.   Electronically Signed   By: Rise Mu M.D.   On: 10/01/2020 04:54     Objective:  VS:  HT:    WT:   BMI:     BP:(!) 130/91  HR:73bpm  TEMP: ( )  RESP:  Physical Exam Constitutional:       General: He is not in acute distress.    Appearance: Normal appearance. He is not ill-appearing.  HENT:     Head: Normocephalic and atraumatic.     Right Ear: External ear normal.     Left Ear: External ear normal.  Eyes:     Extraocular Movements: Extraocular movements intact.  Cardiovascular:     Rate and Rhythm: Normal rate.     Pulses: Normal pulses.  Abdominal:     General: There is no distension.     Palpations: Abdomen is soft.  Musculoskeletal:        General: No tenderness or signs of injury.     Right lower leg: No edema.     Left lower leg: No edema.     Comments: Patient has good distal strength without clonus.  Positive slump test on the right.  Skin:    Findings: No erythema or rash.  Neurological:     General: No focal deficit present.     Mental Status: He is alert and oriented to person, place, and time.     Sensory: No sensory deficit.     Motor: No weakness or abnormal muscle tone.     Coordination: Coordination normal.  Psychiatric:        Mood and Affect: Mood normal.        Behavior: Behavior normal.      Imaging: XR C-ARM NO REPORT  Result Date: 10/15/2020 Please see Notes tab for imaging impression.

## 2020-11-11 NOTE — Progress Notes (Deleted)
Cardiology Office Note   Date:  11/11/2020   ID:  ALEKSI BRUMMET, DOB 1961-10-31, MRN [id number]  PCP:  Daisy Floro, MD  Cardiologist:  No primary care provider on file. EP: None  No chief complaint on file.     History of Present Illness: Bruce [name] is a 59 y.o. male with a PMH of HTN, HLD, OSA on CPAP, and strong family history of CAD who presents for ***  He was last evaluated by cardiology via a telemedicine visit with Dr. Tresa Endo 05/2019, at which time he was doing well from a cardiac standpoint. 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 He had no complaints of chest pain and was 100% compliant with his CPAP. His olmesartan was increased to 40mg  daily for improved blood pressure control and he was recommended for ongoing dietary/lifestyle modifications to control his cholesterol in addition to atorvastatin. His last echocardiogram in 2014 showed EF 55-60%, G1DD, no RWMA, and no significant valvular abnormalities.   1. HTN: BP *** today - Continue olmesartan  2. HLD: no recent lipids, though LDL 88 in 2019; at goal of <100. Not on statin given prior issues with elevated LFTs with atorvastatin - Will check FLP today - Continue zetia and omega 3  3. OSA: on CPAP - Continue CPAP  4. Family history of CAD: father had first MI at 23 yo with subsequent CABG and ICD placement. Mother also with MI.  - Continue aspirin - Continue risk factor modifications to promote BP <130/80, LDL <100, and A1C <7  Past Medical History:  Diagnosis Date  . Arthritis    not diagnosed. hip  . GERD (gastroesophageal reflux disease)   . History of kidney stones   . Hypertension   . Sleep apnea    wears cpap. 4.5 is one setting    Past Surgical History:  Procedure Laterality Date  . CYSTOSCOPY/URETEROSCOPY/HOLMIUM LASER/STENT PLACEMENT Right 05/25/2020   Procedure: CYSTOSCOPY/RETROGRADE/URETEROSCOPY/HOLMIUM LASER/STENT PLACEMENT;  Surgeon: 05/27/2020, MD;  Location: [facility];  Service: Urology;  Laterality: Right;  . EXTRACORPOREAL SHOCK WAVE LITHOTRIPSY Right 03/05/2020   Procedure: EXTRACORPOREAL SHOCK WAVE LITHOTRIPSY (ESWL);  Surgeon: 05/05/2020, MD;  Location: [facility];  Service: Urology;  Laterality: Right;  . ruptured disc  2002   Dr. 2003 YATES L5 MICRODIS  . TONSILLECTOMY     as child     Current Outpatient Medications  Medication Sig Dispense Refill  . aspirin EC 81 MG tablet Take 81 mg by mouth daily. Swallow whole.    . Coenzyme Q10 (EQL COQ10) 300 MG CAPS Take 1 capsule by mouth daily.    Tammy Sours ezetimibe (ZETIA) 10 MG tablet Take 1 tablet (10 mg total) by mouth daily. Pt must keep appt with provider in Dec for further refills 60 tablet 0  . fish oil-omega-3 fatty acids 1000 MG capsule Take by mouth daily. 1200 MG CAPS. Takes 4 daily.    Jan HYDROcodone-acetaminophen (NORCO) 5-325 MG tablet Take 1 tablet by mouth every 4 (four) hours as needed for moderate pain. 20 tablet 0  . meloxicam (MOBIC) 15 MG tablet Take 1 tablet (15 mg total) by mouth daily. Take with food 30 tablet 0  . olmesartan (BENICAR) 40 MG tablet Take 1 tablet (40 mg total) by mouth daily. NEED OV. 30 tablet 1  . ondansetron (ZOFRAN) 4 MG tablet Take 1 tablet (4 mg total) by mouth every 8 (eight) hours as needed  for nausea or vomiting. 10 tablet 0  . oxybutynin (DITROPAN) 5 MG tablet Take 1 tablet (5 mg total) by mouth every 8 (eight) hours as needed for bladder spasms. 30 tablet 1  . oxyCODONE-acetaminophen (PERCOCET/ROXICET) 5-325 MG tablet Take 1 tablet by mouth every 4 (four) hours as needed for severe pain. 10 tablet 0  . tamsulosin (FLOMAX) 0.4 MG CAPS capsule Take 1 capsule (0.4 mg total) by mouth daily. 30 capsule 0   No current facility-administered medications for this visit.    Allergies:   Patient has no known allergies.    Social History:  The patient  reports that he has never smoked. He has  never used smokeless tobacco. He reports that he does not drink alcohol and does not use drugs.   Family History:  The patient's family history includes Colon cancer in his maternal grandmother; Heart attack in his father, mother, paternal grandfather, and paternal grandmother; Heart disease in his paternal grandfather and paternal grandmother; Hyperlipidemia in his father and mother; Hypertension in his mother.    ROS:  Please see the history of present illness.   Otherwise, review of systems are positive for none.   All other systems are reviewed and negative.    PHYSICAL EXAM: VS:  There were no vitals taken for this visit. , BMI There is no height or weight on file to calculate BMI. GEN: Well nourished, well developed, in no acute distress HEENT: normal Neck: no JVD, carotid bruits, or masses Cardiac: ***RRR; no murmurs, rubs, or gallops,no edema  Respiratory:  clear to auscultation bilaterally, normal work of breathing GI: soft, nontender, nondistended, + BS MS: no deformity or atrophy Skin: warm and dry, no rash Neuro:  Strength and sensation are intact Psych: euthymic mood, full affect   EKG:  EKG {ACTION; IS/IS [id number]} ordered today. The ekg ordered today demonstrates ***   Recent Labs: 03/03/2020: ALT 52 05/20/2020: Platelets 211 05/25/2020: BUN 23; Creatinine, Ser 1.00; Hemoglobin 16.3; Potassium 4.3; Sodium 141    Lipid Panel    Component Value Date/Time   CHOL 142 11/11/2018 1059   CHOL 166 05/25/2013 0855   TRIG 99 11/11/2018 1059   TRIG 179 (H) 05/25/2013 0855   HDL 35 (L) 11/11/2018 1059   HDL 33 (L) 05/25/2013 0855   CHOLHDL 4.1 11/11/2018 1059   VLDL 33 (H) 04/28/2017 0908   LDLCALC 88 11/11/2018 1059   LDLCALC 97 05/25/2013 0855      Wt Readings from Last 3 Encounters:  05/25/20 225 lb 1.6 oz (102.1 kg)  03/05/20 235 lb 3.2 oz (106.7 kg)  03/03/20 240 lb (108.9 kg)      Other studies Reviewed: Additional studies/ records that were reviewed  today include:   Echocardiogram 2014: - Left ventricle: The cavity size was normal. Wall thickness  was normal. Systolic function was normal. The estimated  ejection fraction was in the range of 55% to 60%. Wall  motion was normal; there were no regional wall motion  abnormalities. Doppler parameters are consistent with  abnormal left ventricular relaxation (grade 1 diastolic  dysfunction).  - Mitral valve: Mildly thickened leaflets . Systolic bowing  without prolapse.  - Atrial septum: No defect or patent foramen ovale was  identified.      ASSESSMENT AND PLAN:  1.  ***   Current medicines are reviewed at length with the patient today.  The patient {ACTIONS; HAS/DOES NOT HAVE:19233} concerns regarding medicines.  The following changes have been made:  {PLAN; NO CHANGE:13088:s}  Labs/ tests ordered today include: *** No orders of the defined types were placed in this encounter.    Disposition:   FU with *** in {gen number [id number]} {Days to years:10300}  Signed, Beatriz Stallion, PA-C  11/11/2020 10:56 AM

## 2020-11-13 ENCOUNTER — Ambulatory Visit: Payer: BC Managed Care – PPO | Admitting: Medical

## 2020-11-15 ENCOUNTER — Other Ambulatory Visit: Payer: Self-pay

## 2020-11-15 ENCOUNTER — Encounter: Payer: Self-pay | Admitting: Cardiovascular Disease

## 2020-11-15 ENCOUNTER — Ambulatory Visit (INDEPENDENT_AMBULATORY_CARE_PROVIDER_SITE_OTHER): Payer: BC Managed Care – PPO | Admitting: Cardiovascular Disease

## 2020-11-15 DIAGNOSIS — N201 Calculus of ureter: Secondary | ICD-10-CM

## 2020-11-15 DIAGNOSIS — Z8249 Family history of ischemic heart disease and other diseases of the circulatory system: Secondary | ICD-10-CM | POA: Diagnosis not present

## 2020-11-15 DIAGNOSIS — G4733 Obstructive sleep apnea (adult) (pediatric): Secondary | ICD-10-CM | POA: Diagnosis not present

## 2020-11-15 DIAGNOSIS — E785 Hyperlipidemia, unspecified: Secondary | ICD-10-CM

## 2020-11-15 DIAGNOSIS — I10 Essential (primary) hypertension: Secondary | ICD-10-CM | POA: Diagnosis not present

## 2020-11-15 MED ORDER — EZETIMIBE 10 MG PO TABS
10.0000 mg | ORAL_TABLET | Freq: Every day | ORAL | 0 refills | Status: DC
Start: 2020-11-15 — End: 2020-11-27

## 2020-11-15 MED ORDER — OLMESARTAN MEDOXOMIL 40 MG PO TABS
40.0000 mg | ORAL_TABLET | Freq: Every day | ORAL | 1 refills | Status: DC
Start: 2020-11-15 — End: 2020-12-13

## 2020-11-15 NOTE — Patient Instructions (Signed)
Medication Instructions:  No changes *If you need a refill on your cardiac medications before your next appointment, please call your pharmacy*   Lab Work: None ordered If you have labs (blood work) drawn today and your tests are completely normal, you will receive your results only by: [name] MyChart Message (if you have MyChart) OR . A paper copy in the mail If you have any lab test that is abnormal or we need to change your treatment, we will call you to review the results.   Testing/Procedures: Dr. Tresa Endo has ordered a CT coronary calcium score. This test is done at [address] on the 3rd Floor. There is an $99 out-of-pocket charge for this test.  Coronary Calcium Scan A coronary calcium scan is an imaging test used to look for deposits of calcium and other fatty materials (plaques) in the inner lining of the blood vessels of the heart (coronary arteries). These deposits of calcium and plaques can partly clog and narrow the coronary arteries without producing any symptoms or warning signs. This puts a person at risk for a heart attack. This test can detect these deposits before symptoms develop.  Tell a health care provider about: . Any allergies you have . All medicines you are taking, including vitamins, herbs, eye drops, creams, and over-the-counter medicines. . Any problems you or family members have had with anesthetic medicines. . Any blood disorders you have. . Any surgeries you have had. . Any medical conditions you have. . Whether you are pregnant or may be pregnant.  What are the risks? Generally, this is a safe procedure. However, problems may occur, including: . Harm to a pregnant woman and her unborn baby. This test involves the use of radiation. Radiation exposure can be dangerous to a pregnant woman and her unborn baby. If you are pregnant, you generally should not have this procedure done. . Slight increase in the risk of cancer. This is because of the radiation  involved in the test.  What happens before the procedure? No preparation is needed for this procedure.  What happens during the procedure? [name] You will undress and remove any jewelry around your neck or chest. . You will put on a hospital gown. . Sticky electrodes will be placed on your chest. The electrodes will be connected to an electrocardiogram (ECG) machine to record a tracing of the electrical activity of your heart. . A CT scanner will take pictures of your heart. During this time, you will be asked to lie still and hold your breath for 2-3 seconds while a picture of your heart is being taken. The procedure may vary among health care providers and hospitals.  What happens after the procedure? You can get dressed. You can return to your normal activities. It is up to you to get the results of your test. Ask your health care provider, or the department that is doing the test, when your results will be ready.  Summary . A coronary calcium scan is an imaging test used to look for deposits of calcium and other fatty materials (plaques) in the inner lining of the blood vessels of the heart (coronary arteries). Bruce [name], this is a safe procedure. Tell your health care provider if you are pregnant or may be pregnant. . No preparation is needed for this procedure. . A CT scanner will take pictures of your heart. . You can return to your normal activities after the scan is done. . This information is not intended to  replace advice given to you by your health care provider. Make sure you discuss any questions you have with your health care provider.     Follow-Up: At [facility], you and your health needs are our priority.  As part of our continuing mission to provide you with exceptional heart care, we have created designated Provider Care Teams.  These Care Teams include your primary Cardiologist (physician) and Advanced Practice Providers (APPs -  Physician Assistants and Nurse  Practitioners) who all work together to provide you with the care you need, when you need it.  We recommend signing up for the patient portal called "MyChart".  Sign up information is provided on this After Visit Summary.  MyChart is used to connect with patients for Virtual Visits (Telemedicine).  Patients are able to view lab/test results, encounter notes, upcoming appointments, etc.  Non-urgent messages can be sent to your provider as well.   To learn more about what you can do with MyChart, go to [email or web address].    Your next appointment:   12 month(s)  The format for your next appointment:   In Person  Provider:   Nicki Guadalajara, MD   Other Instructions None

## 2020-11-15 NOTE — Progress Notes (Signed)
Patient ID: Bruce [name], male   DOB: 10-Sep-1961, 59 y.o.   MRN: [id number]   Primary M.D.: Dr. Thomasene Mohair  HPI: [name] is a 59 white male presents to the office for 61 month follow-up cardiology evaluation.  [name] a strong family history for coronary artery disease and denies any known CAD or episodes of chest pain. He states that over the years he has had slight progression of  cholesterol elevation but has consistently had low good cholesterol levels. He is followed by Dr. Judithann Sauger.  Laboratory August 2013 showed a total cholesterol 178, triglycerides 196, HDL cholesterol 33, non-HDL cholesterol 145 and LDL cholesterol 113. He had never been on lipid lowering therapy and at that time did not take any medications with the exception of aspirin and over-the-counter fish oil.    In 2014, after his mother had suffered a heart attack, he underwent a routine treadmill test.  His was interpreted as negative, adequate Bruce treadmill test and he was able to exercise for 13.7 that workload and achieving AP, rate of 102% of age-predicted maximum heart rate.  He did not have diagnostic ST changes of ischemia.  He underwent an echo Doppler study on 05/17/2013 which revealed normal systolic function.  There was grade 1 diastolic dysfunction.  He had mild thickening of his mitral valve with systolic bowing without definitive prolapse.  Over the past several years, he has continued to be active.  He denies any exertional precipitation of chest pain.  He denies any change in exercise tolerance.  He was started on atorvastatin 20 mg and takes omega-3 fatty acids as well as coenzyme Q10 for hyperlipidemia.  Due to complaints of significant deterioration of his sleep quality with loud snoring and nonrestorative sleep.  I referred him for sleep study  which was done on 07/30/2015.  He was found to have mild obstructive sleep apnea with an AHI overall of 7.3 per hour.  However, he had a severe  positional component with severe sleep apnea with supine position with an AHI of 51.4 per hour.  There was mild oxygen desaturation to a nadir of 86%. There was loud snoring.  He underwent a CPAP titration trial on 08/21/2015 and ultimately his obstructive apnea was improved at a CPAP pressure of 15.  However, with his previous documentation of a marked positional component to his sleep apnea a CPAP auto unit was recommended.  CPAP was instituted on 10/04/2015.  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  When I saw him in January 2017 he was meeting Medicare compliance standards with 100% use.  He notes significantly more energy.  He denies any residual daytime sleepiness.  He is unaware of breakthrough snoring.  He denies any nocturnal palpitations.  I saw him in November 2018 at which time he remained stable and he denied any episodes of chest pain shortness of breath or palpitations.  He was continuing to use CPAP with 100% compliance and is continued to note significant benefit.    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  He has continued to be on losartan 50 mg daily for hypertension.  He continues to be on Zetia 10 mg and omega-3 fatty acid.  Laboratory on  10/15/2017  showed total cholesterol 171, HDL 36, LDL 90, triglycerides 225.  He states that he has some dark chocolate every day.  He has a history of GERD and intermittently has used omeprazole.    I saw him in December 2019 at which time he remained stable.  He denies any chest pain PND orthopnea.  Earlier this year he had issues with left hip pain and was diagnosed as having trochanteric bursitis.   [facility] is his DME company and he uses CPAP with 100% compliance.  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   I last evaluated  him in a telemedicine visit in June 2021.  At a prior evaluation, I recommended switching losartan to olmesartan 20 mg.  His blood pressure had improved but blood pressure typically was running in the 130s to 140s.  At that telemedicine visit I recommended further titration of olmesartan to 40 mg daily.  He continues to be on Zetia and omega-3 fatty acids for hyperlipidemia.  In December 2019 and LDL cholesterol was 88.    Since I last saw him, he had issues with development of a kidney stone.  This was unsuccessfully treated with lithotripsy and he ultimately required cystoscopy with right uteroscopy and laser lithotripsy with right JJ stent placement and a 1.3 cm right distal ureteral calculus was removed.  Since that time he has significantly changed his diet.  He gave up thinking sodas which contain significant sugar.  He lost 15 pounds.  He also required injection into his left hip.  And also he developed discomfort at a previous L5-S1 mini discectomy for which she required injection.  Presently he denies chest pain or shortness of breath.  He has a strong family history for premature coronary artery disease in family members with a father having had an MI at age 65 and subsequently had CABG surgery x3 as well as stents and a defibrillator.  He has continued to use CPAP therapy with 100% compliance and has an old CPAP machine which is still functional.  He presents for reevaluation.  Past Medical History:  Diagnosis Date  . Arthritis    not diagnosed. hip  . GERD (gastroesophageal reflux disease)   . History of kidney stones   . Hypertension   . Sleep apnea    wears cpap. 4.5 is one setting    Past Surgical History:  Procedure Laterality Date  . CYSTOSCOPY/URETEROSCOPY/HOLMIUM LASER/STENT PLACEMENT Right 05/25/2020   Procedure: CYSTOSCOPY/RETROGRADE/URETEROSCOPY/HOLMIUM LASER/STENT PLACEMENT;  Surgeon: Ceasar Mons, MD;  Location: [facility];  Service: Urology;   Laterality: Right;  . EXTRACORPOREAL SHOCK WAVE LITHOTRIPSY Right 03/05/2020   Procedure: EXTRACORPOREAL SHOCK WAVE LITHOTRIPSY (ESWL);  Surgeon: Ceasar Mons, MD;  Location: [facility];  Service: Urology;  Laterality: Right;  . ruptured disc  2002   Dr. Marya Amsler YATES L5 MICRODIS  . TONSILLECTOMY     as child    No Known Allergies  Current Outpatient Medications  Medication Sig Dispense Refill  . aspirin EC 81 MG tablet Take 81 mg by mouth daily. Swallow whole.    . fish oil-omega-3 fatty acids 1000 MG capsule Take by mouth daily. 1200 MG CAPS. Takes 4 daily.    . meloxicam (MOBIC) 15 MG tablet Take 1 tablet (15 mg total) by mouth daily. Take with food 30 tablet 0  . oxybutynin (DITROPAN) 5 MG tablet Take 1 tablet (5 mg total) by mouth every 8 (eight) hours as needed for bladder spasms. 30 tablet 1  .  ezetimibe (ZETIA) 10 MG tablet Take 1 tablet (10 mg total) by mouth daily. Pt must keep appt with provider in Dec for further refills 60 tablet 0  . olmesartan (BENICAR) 40 MG tablet Take 1 tablet (40 mg total) by mouth daily. NEED OV. 30 tablet 1   No current facility-administered medications for this visit.    Socially, he completed 12th grade education. He is married for 35 years the he works for [employer]. He is the owner and in Press photographer. He exercises approximately 2 times per week doing he routinely does not do cardiac or aerobic workouts. He never smoked tobacco. He denies alcohol use.  Family History  Problem Relation Age of Onset  . Heart attack Mother   . Hyperlipidemia Mother   . Hypertension Mother   . Heart attack Father   . Hyperlipidemia Father   . Colon cancer Maternal Grandmother   . Heart attack Paternal Grandmother   . Heart disease Paternal Grandmother   . Heart attack Paternal Grandfather   . Heart disease Paternal Grandfather     ROS General: Negative; No fevers, chills, or night sweats;  HEENT: Negative;  No changes in vision or hearing, sinus congestion, difficulty swallowing Pulmonary: Negative; No cough, wheezing, shortness of breath, hemoptysis Cardiovascular: Negative; No chest pain, presyncope, syncope, palpitations GI: Negative; No nausea, vomiting, diarrhea, or abdominal pain GU: Recent kidney stone Musculoskeletal: Recent left hip injection Hematologic/Oncology: Negative; no easy bruising, bleeding Endocrine: Negative; no heat/cold intolerance; no diabetes Neuro: Recent L5-S1 injection Skin: Negative; No rashes or skin lesions Psychiatric: Negative; No behavioral problems, depression Sleep: Positive for obstructive sleep apnea, on CPAP therapy with resolution of prior nonrestorative sleep, snoring, and daytime sleepiness;  no bruxism, restless legs, hypnogognic hallucinations, no cataplexy Other comprehensive 14 point system review is negative.   PE BP 116/74   [name] 82   Ht $R'6\' 5"'WM$  (1.956 m)   Wt 226 lb (102.5 kg)   SpO2 98%   BMI 26.80 kg/m    Repeat blood pressure by me 112/70  Wt Readings from Last 3 Encounters:  11/15/20 226 lb (102.5 kg)  05/25/20 225 lb 1.6 oz (102.1 kg)  03/05/20 235 lb 3.2 oz (106.7 kg)   General: Alert, oriented, no distress.  Skin: normal turgor, no rashes, warm and dry HEENT: Normocephalic, atraumatic. Pupils equal round and reactive to light; sclera anicteric; extraocular muscles intact;  Nose without nasal septal hypertrophy Mouth/Parynx benign; Mallinpatti scale 3 Neck: No JVD, no carotid bruits; normal carotid upstroke Lungs: clear to ausculatation and percussion; no wheezing or rales Chest wall: without tenderness to palpitation Heart: PMI not displaced, RRR, s1 s2 normal, 1/6 systolic murmur, no diastolic murmur, no rubs, gallops, thrills, or heaves Abdomen: soft, nontender; no hepatosplenomehaly, BS+; abdominal aorta nontender and not dilated by palpation. Back: no CVA tenderness Pulses 2+ Musculoskeletal: full range of motion,  normal strength, no joint deformities Extremities: no clubbing cyanosis or edema, Homan's sign negative  Neurologic: grossly nonfocal; Cranial nerves grossly wnl Psychologic: Normal mood and affect   ECG (independently read by me): NSR at 84, no significant ST changes; no ectopy  December 2019 ECG (independently read by me): Normal sinus rhythm at 71 bpm.  No ectopy.  Normal intervals.  November 2018 ECG (independently read by me): normal sinus rhythm at 62 bpm  November 2017 ECG (independently read by me): Normal sinus rhythm at 70 bpm.  September 2016 ECG (independently read by me): Sinus rhythm at 66 bpm.  No ectopy.  Normal intervals.  June 2016 ECG (independently read by me):  Normal sinus rhythm at 68 bpm.  Normal intervals.  Prior ECG: Sinus bradycardia at 59 beats per minute. PR interval 178 ms, QTc interval 403 ms.  LABS: Blood work from [facility] on 09/18/2016 was reviewed.   Hemoglobin 16.8, hematocrit 47.6.  Fasting glucose 120.  BUN 16, Cr1.09.  LFTs normal with an  an AST of 26 and ALT of 31.  Lipid studies revealed total cholesterol 180, triglycerides 194, HDL 32, LDL 109.  Most recent blood work from 10/15/2017 was reviewed.  Total cholesterol 171, HDL 36, LDL 90, triglycerides 225.  Hemoglobin was a 5.6.  Hemoglobin 15.4.  Creatinine 0.96.  TSH 2.5.  ALT 37.  Laboratory from Apr 25, 2020 by Dr. Lona Kettle was reviewed.  Total cholesterol 136, LDL 83, HDL 33, triglycerides 109.  Hemoglobin A1c from December 2019 5.7.  Creatinine from May 25, 2020 was 1.0.   BMP Latest Ref Rng & Units 05/25/2020 05/20/2020 03/03/2020  Glucose 70 - 99 mg/dL 122(H) 141(H) 168(H)  BUN 6 - 20 mg/dL 23(H) 20 23(H)  Creatinine 0.61 - 1.24 mg/dL 1.00 1.19 1.47(H)  BUN/Creat Ratio 6 - 22 (calc) - - -  Sodium 135 - 145 mmol/L 141 140 136  Potassium 3.5 - 5.1 mmol/L 4.3 4.4 4.3  Chloride 98 - 111 mmol/L 103 104 101  CO2 22 - 32 mmol/L - 27 26  Calcium 8.9 - 10.3 mg/dL - 9.5 9.2    Hepatic Function Latest Ref Rng & Units 03/03/2020 11/11/2018 04/28/2017  Total Protein 6.5 - 8.1 g/dL 7.4 6.9 6.7  Albumin 3.5 - 5.0 g/dL 4.1 - 4.3  AST 15 - 41 U/L 32 28 18  ALT 0 - 44 U/L 52(H) 46 21  Alk Phosphatase 38 - 126 U/L 47 - 49  Total Bilirubin 0.3 - 1.2 mg/dL 1.0 0.5 0.7  Bilirubin, Direct <=0.2 mg/dL - - -   CBC Latest Ref Rng & Units 05/25/2020 05/20/2020 03/03/2020  WBC 4.0 - 10.5 K/uL - 13.3(H) 9.8  Hemoglobin 13.0 - 17.0 g/dL 16.3 16.1 16.4  Hematocrit 39.0 - 52.0 % 48.0 47.0 47.3  Platelets 150 - 400 K/uL - 211 219   Lab Results  Component Value Date   MCV 93.1 05/20/2020   MCV 91.8 03/03/2020   MCV 92.1 04/28/2017   Lab Results  Component Value Date   TSH 1.91 11/11/2018   Lab Results  Component Value Date   HGBA1C 5.7 (H) 11/11/2018     Lipid Panel     Component Value Date/Time   CHOL 142 11/11/2018 1059   CHOL 166 05/25/2013 0855   TRIG 99 11/11/2018 1059   TRIG 179 (H) 05/25/2013 0855   HDL 35 (L) 11/11/2018 1059   HDL 33 (L) 05/25/2013 0855   CHOLHDL 4.1 11/11/2018 1059   VLDL 33 (H) 04/28/2017 0908   LDLCALC 88 11/11/2018 1059   LDLCALC 97 05/25/2013 0855   IMPRESSION:  1. Essential hypertension   2. Family history of premature CAD   3. OSA (obstructive sleep apnea)   4. Hyperlipidemia with target LDL less than 100   5. Ureteral stone: June 2021     ASSESSMENT AND PLAN:  [name] is a 59 year-old Caucasian male who has a strong family history for coronary artery disease with both parents having had heart attacks. His father suffered his initial heart attack at age 56 and subsequently had CABG surgery  x3 and also has stents as well as a defibrillator.  In 2014, he had a negative adequate graded exercise treadmill test which was done after his mother had suffered a heart attack.  An echo Doppler study showed normal systolic function with mild diastolic relaxation abnormality and a mildly thickened mitral valve with systolic bowing  without definitive prolapse.  [place] 19, I switched him from losartan to olmesartan 20 mg and at his most recent telemedicine evaluation in June 2020 with blood pressure still in the upper 130s to 140 range further titration to 40 mg was done.  He has noted improvement in blood pressure since being on the olmesartan 40 mg.  He has been on Zetia 10 mg daily.  Most recent LDL cholesterol in May 2021 was 83.  With a strong family history of coronary artery disease we discussed obtaining a coronary calcium score for additional risk stratification.  If coronary calcification is present aggressive lipid management will be necessary in attempt to induce potential plaque regression.  He feels significantly improved since he changed his diet following his 1.3 cm kidney stone.  He has lost over 15 pounds.  He is reduced his meat intake and I discussed with him a heart healthy Mediterranean diet.  Is avoided sugars and is given up soft drinks.  We will try to schedule him for the coronary calcium score and I will contact him regarding the results.  Pending final results just with his medication may be necessary with more recent follow-up evaluation.  He continues to use CPAP with 100% compliance.  We discussed that he may be a candidate for future new machine.  However with the current backorder and since his old machine is still functioning he will continue with his old machine and less issues arrive for the time being.  I will see him in 1 year for reevaluation or sooner if needed sinus calcium score.   Troy Sine, MD, [facility] 11/15/2020 6:41 PM

## 2020-11-16 ENCOUNTER — Telehealth: Payer: Self-pay | Admitting: Cardiovascular Disease

## 2020-11-16 NOTE — Telephone Encounter (Signed)
Spoke with patient regarding appointment scheduled for the Calcium scoring ordered by Dr, Kelly---scheduled 11/22/20 at 2:00pm at [facility]--[address]----phone [phone].  Patient voiced his understanding.

## 2020-11-19 DIAGNOSIS — G4733 Obstructive sleep apnea (adult) (pediatric): Secondary | ICD-10-CM | POA: Diagnosis not present

## 2020-11-22 ENCOUNTER — Other Ambulatory Visit: Payer: Self-pay

## 2020-11-22 ENCOUNTER — Ambulatory Visit (INDEPENDENT_AMBULATORY_CARE_PROVIDER_SITE_OTHER)
Admission: RE | Admit: 2020-11-22 | Discharge: 2020-11-22 | Disposition: A | Discharge status: Home / Self Care | Payer: Self-pay | Source: Ambulatory Visit | Attending: Medical | Admitting: Medical

## 2020-11-22 DIAGNOSIS — Z8249 Family history of ischemic heart disease and other diseases of the circulatory system: Secondary | ICD-10-CM

## 2020-11-22 DIAGNOSIS — E785 Hyperlipidemia, unspecified: Secondary | ICD-10-CM

## 2020-11-22 IMAGING — CT CT CARDIAC CORONARY ARTERY CALCIUM SCORE
3 series · 14 of 20 positions shown, 15 images · non-contrast
Comparison: None
COMPARISON: None

Addendum:
EXAM:
OVER-READ INTERPRETATION  CT CHEST

The following report is an over-read performed by radiologist Dr.
over-read does not include interpretation of cardiac or coronary
anatomy or pathology. The calcium score Interpretation by the
cardiologist is attached.
TECHNIQUE: The patient was scanned on a Siemens Force scanner. Axial
non-contrast 3 mm slices were carried out through the heart. The
data set was analyzed on a dedicated work station and scored using
the Agatson method.

[Series 2: casc 3.0 bv41 2 bestdiast 73 % · axial · 0.39mm/px · z∈[-215,-131]mm · 4 of 48 slices shown, 5 images]
[im 10/48  vessel]
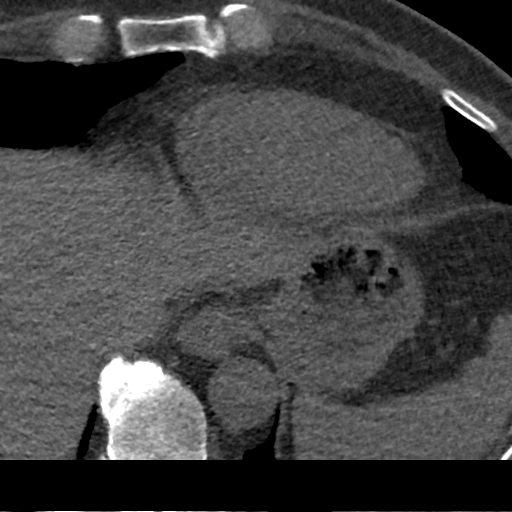
[im 10/48  lung]
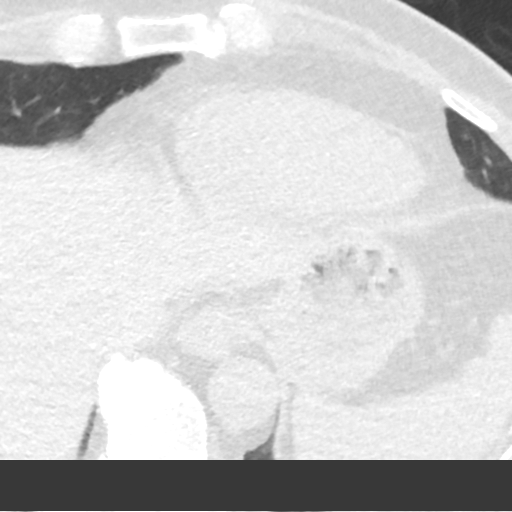
[im 19/48  vessel]
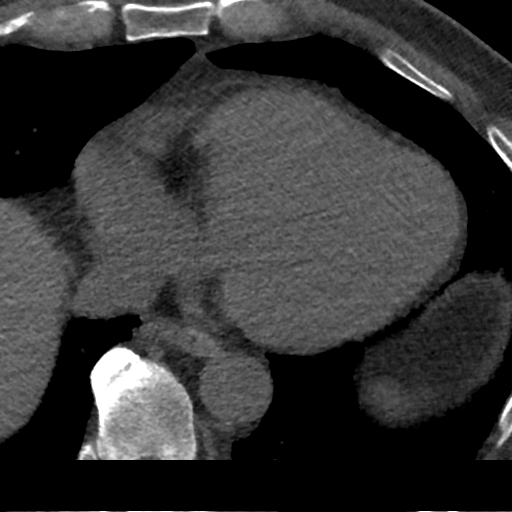
[im 29/48  vessel]
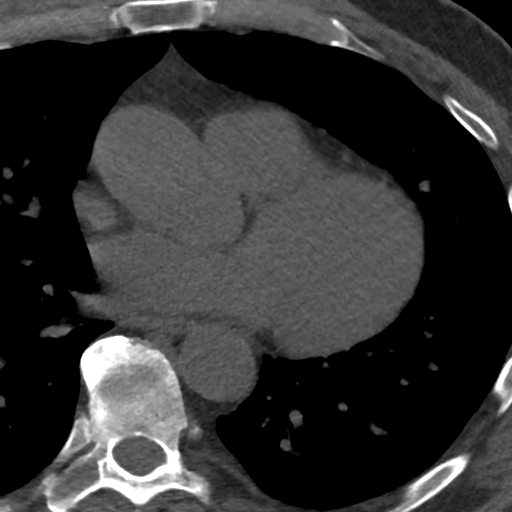
[im 38/48  vessel]
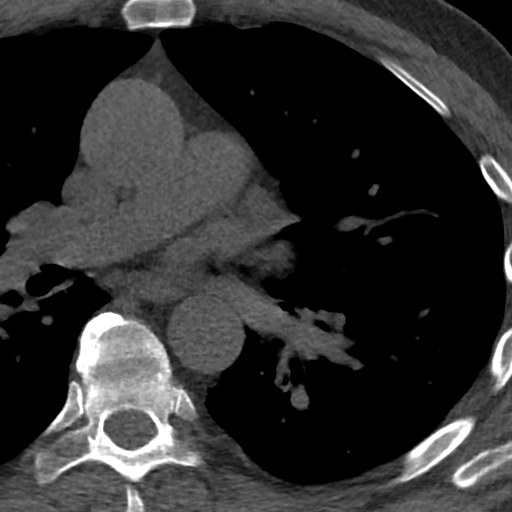

[Series 3: lung 71 % · axial · 0.77mm/px · z∈[-220,-124]mm · 5 of 48 slices shown]
[im 8/48  lung]
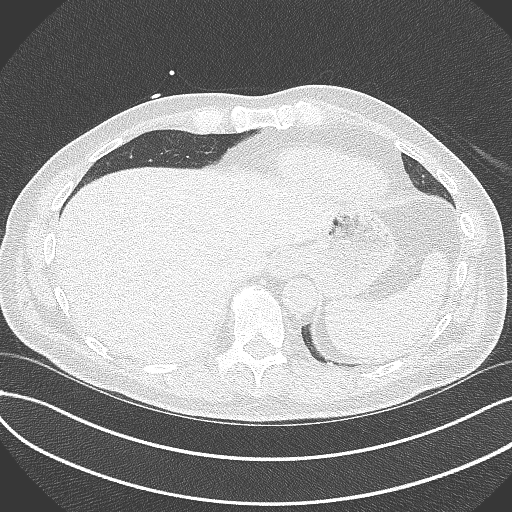
[im 16/48  lung]
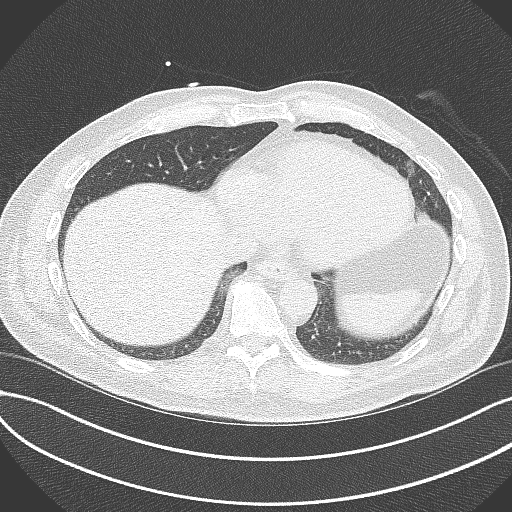
[im 24/48  lung]
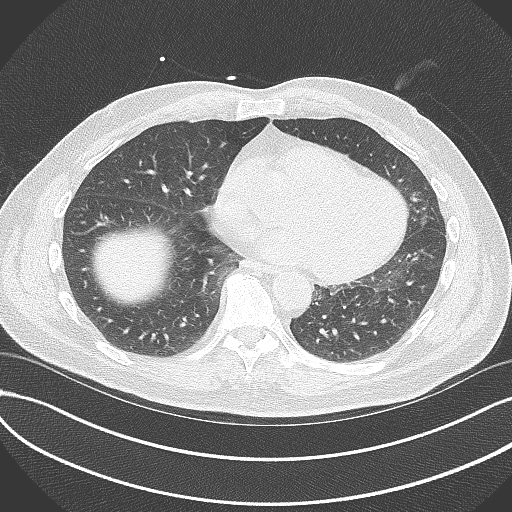
[im 32/48  lung]
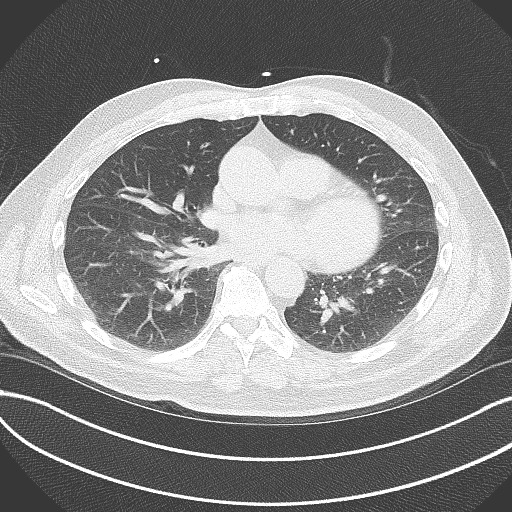
[im 40/48  lung]
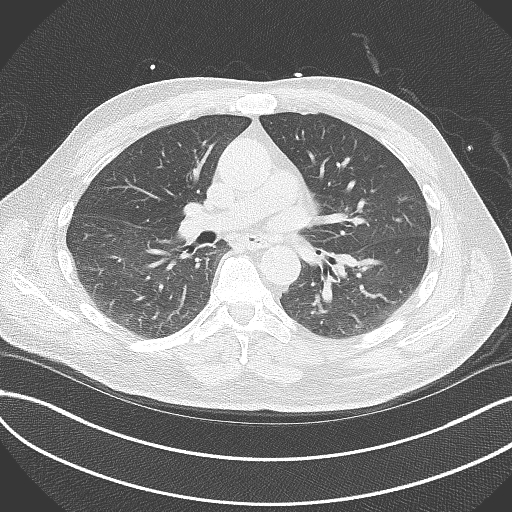

[Series 4: lung st 71 % · axial · 0.77mm/px · z∈[-220,-124]mm · 5 of 48 slices shown]
[im 8/48  lung]
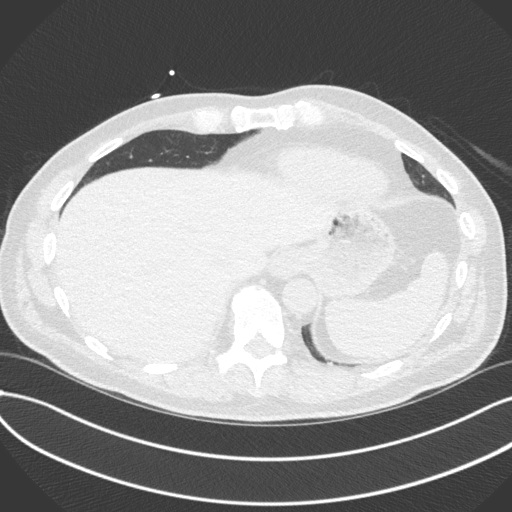
[im 16/48  lung]
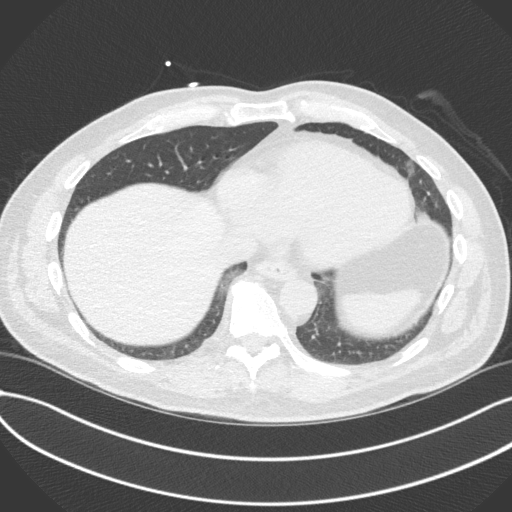
[im 24/48  lung]
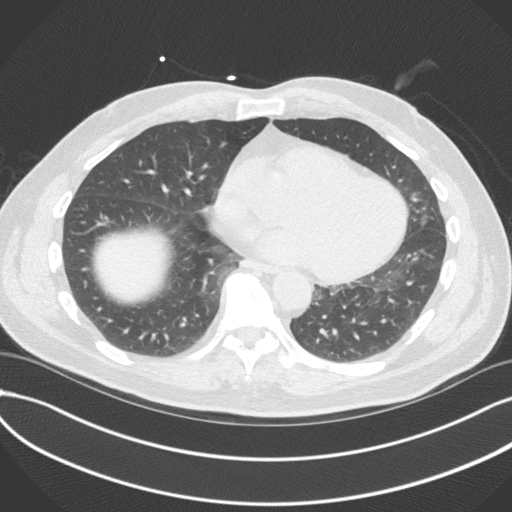
[im 32/48  lung]
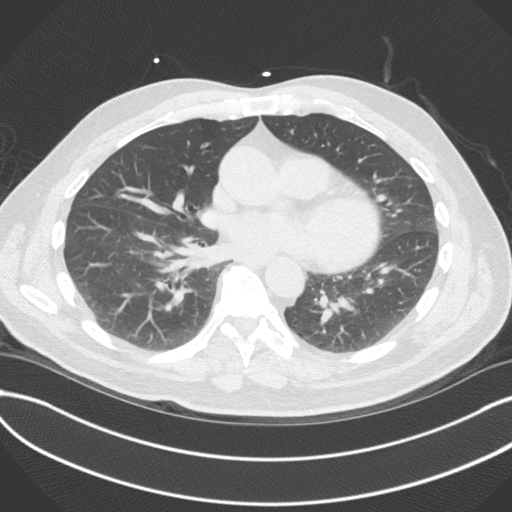
[im 40/48  lung]
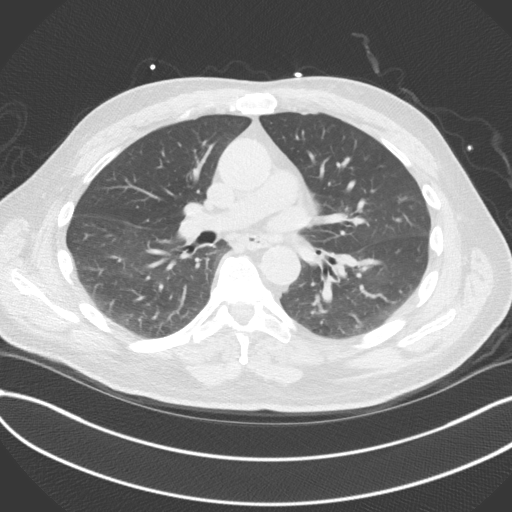

[14 of 20 positions shown; findings below may reference images not displayed]

FINDINGS: Vascular: Normal aortic caliber.

Mediastinum/Nodes: No imaged thoracic adenopathy.

Lungs/Pleura: No pleural fluid. 3 mm left lower lobe pulmonary
nodule on [DATE].

Upper Abdomen: Normal imaged portions of the liver, spleen, stomach.

Musculoskeletal: Lower thoracic spondylosis. Mild right
hemidiaphragm elevation.
IMPRESSION: 1.  No acute findings in the imaged extracardiac chest.
2. 3 mm left lower lobe pulmonary nodule. No follow-up needed if
patient is low-risk. Non-contrast chest CT can be considered in 12
months if patient is high-risk. This recommendation follows the
consensus statement: Guidelines for Management of Incidental
Pulmonary Nodules Detected on CT Images: From the [REDACTED]AL DATA:  59M with hyperlipidemia and family history of
coronary artery disease for risk stratification.

EXAM:
Coronary Calcium Score
FINDINGS: Non-cardiac: See separate report from [REDACTED].

Ascending Aorta: Mildly dilated.  3.7 cm.

Pericardium: Normal

Coronary arteries: Normal anatomy.  Calcification in the LAD.
IMPRESSION: Coronary calcium score of 57.7. This was 56th percentile for age and
sex matched control.

Mild dilation of the ascending aorta.

*** End of Addendum ***
EXAM:
OVER-READ INTERPRETATION  CT CHEST

The following report is an over-read performed by radiologist Dr.
over-read does not include interpretation of cardiac or coronary
anatomy or pathology. The calcium score Interpretation by the
cardiologist is attached.
FINDINGS: Vascular: Normal aortic caliber.

Mediastinum/Nodes: No imaged thoracic adenopathy.

Lungs/Pleura: No pleural fluid. 3 mm left lower lobe pulmonary
nodule on [DATE].

Upper Abdomen: Normal imaged portions of the liver, spleen, stomach.

Musculoskeletal: Lower thoracic spondylosis. Mild right
hemidiaphragm elevation.
IMPRESSION: 1.  No acute findings in the imaged extracardiac chest.
2. 3 mm left lower lobe pulmonary nodule. No follow-up needed if
patient is low-risk. Non-contrast chest CT can be considered in 12
months if patient is high-risk. This recommendation follows the
consensus statement: Guidelines for Management of Incidental
Pulmonary Nodules Detected on CT Images: From the [HOSPITAL]

## 2020-11-26 ENCOUNTER — Other Ambulatory Visit: Payer: Self-pay

## 2020-11-26 MED ORDER — ROSUVASTATIN CALCIUM 10 MG PO TABS: 10.0000 mg | ORAL_TABLET | Freq: Every day | ORAL | 3 refills | Status: DC

## 2020-11-27 ENCOUNTER — Other Ambulatory Visit: Payer: Self-pay | Admitting: Cardiovascular Disease

## 2020-12-13 ENCOUNTER — Other Ambulatory Visit: Payer: Self-pay | Admitting: Cardiovascular Disease

## 2021-01-03 ENCOUNTER — Other Ambulatory Visit: Payer: Self-pay | Admitting: Physical Medicine and Rehabilitation

## 2021-01-03 NOTE — Telephone Encounter (Signed)
Please advise 

## 2021-01-15 ENCOUNTER — Other Ambulatory Visit: Payer: Self-pay | Admitting: Cardiovascular Disease

## 2021-01-19 ENCOUNTER — Other Ambulatory Visit: Payer: Self-pay | Admitting: Cardiovascular Disease

## 2021-01-23 DIAGNOSIS — G4733 Obstructive sleep apnea (adult) (pediatric): Secondary | ICD-10-CM | POA: Diagnosis not present

## 2021-01-31 ENCOUNTER — Other Ambulatory Visit: Payer: Self-pay | Admitting: Physical Medicine and Rehabilitation

## 2021-01-31 NOTE — Telephone Encounter (Signed)
Please advise 

## 2021-02-08 ENCOUNTER — Other Ambulatory Visit: Payer: Self-pay | Admitting: Cardiovascular Disease

## 2021-04-23 DIAGNOSIS — G4733 Obstructive sleep apnea (adult) (pediatric): Secondary | ICD-10-CM | POA: Diagnosis not present

## 2021-04-30 DIAGNOSIS — L718 Other rosacea: Secondary | ICD-10-CM | POA: Diagnosis not present

## 2021-05-01 DIAGNOSIS — E559 Vitamin D deficiency, unspecified: Secondary | ICD-10-CM | POA: Diagnosis not present

## 2021-05-01 DIAGNOSIS — R7301 Impaired fasting glucose: Secondary | ICD-10-CM | POA: Diagnosis not present

## 2021-05-01 DIAGNOSIS — Z125 Encounter for screening for malignant neoplasm of prostate: Secondary | ICD-10-CM | POA: Diagnosis not present

## 2021-05-01 DIAGNOSIS — Z13220 Encounter for screening for lipoid disorders: Secondary | ICD-10-CM | POA: Diagnosis not present

## 2021-05-01 DIAGNOSIS — Z0000 Encounter for general adult medical examination without abnormal findings: Secondary | ICD-10-CM | POA: Diagnosis not present

## 2021-06-07 DIAGNOSIS — J029 Acute pharyngitis, unspecified: Secondary | ICD-10-CM | POA: Diagnosis not present

## 2021-06-07 DIAGNOSIS — R051 Acute cough: Secondary | ICD-10-CM | POA: Diagnosis not present

## 2021-07-09 DIAGNOSIS — G4733 Obstructive sleep apnea (adult) (pediatric): Secondary | ICD-10-CM | POA: Diagnosis not present

## 2021-08-07 DIAGNOSIS — R3913 Splitting of urinary stream: Secondary | ICD-10-CM | POA: Diagnosis not present

## 2021-08-07 DIAGNOSIS — N201 Calculus of ureter: Secondary | ICD-10-CM | POA: Diagnosis not present

## 2021-11-02 ENCOUNTER — Other Ambulatory Visit: Payer: Self-pay | Admitting: Cardiovascular Disease

## 2021-11-13 ENCOUNTER — Other Ambulatory Visit: Payer: Self-pay | Admitting: Cardiovascular Disease

## 2021-11-19 DIAGNOSIS — G4733 Obstructive sleep apnea (adult) (pediatric): Secondary | ICD-10-CM | POA: Diagnosis not present

## 2022-01-01 ENCOUNTER — Encounter: Payer: Self-pay | Admitting: Cardiovascular Disease

## 2022-01-01 ENCOUNTER — Ambulatory Visit (INDEPENDENT_AMBULATORY_CARE_PROVIDER_SITE_OTHER): Payer: BC Managed Care – PPO | Admitting: Cardiovascular Disease

## 2022-01-01 ENCOUNTER — Other Ambulatory Visit: Payer: Self-pay

## 2022-01-01 DIAGNOSIS — G4733 Obstructive sleep apnea (adult) (pediatric): Secondary | ICD-10-CM | POA: Diagnosis not present

## 2022-01-01 DIAGNOSIS — Z8249 Family history of ischemic heart disease and other diseases of the circulatory system: Secondary | ICD-10-CM

## 2022-01-01 DIAGNOSIS — I10 Essential (primary) hypertension: Secondary | ICD-10-CM | POA: Diagnosis not present

## 2022-01-01 DIAGNOSIS — R931 Abnormal findings on diagnostic imaging of heart and coronary circulation: Secondary | ICD-10-CM

## 2022-01-01 DIAGNOSIS — E785 Hyperlipidemia, unspecified: Secondary | ICD-10-CM | POA: Diagnosis not present

## 2022-01-01 LAB — COMPREHENSIVE METABOLIC PANEL
ALT: 48 IU/L — ABNORMAL HIGH (ref 0–44)
AST: 38 IU/L (ref 0–40)
Albumin/Globulin Ratio: 1.8 (ref 1.2–2.2)
Albumin: 4.8 g/dL (ref 3.8–4.8)
Alkaline Phosphatase: 51 IU/L (ref 44–121)
BUN/Creatinine Ratio: 17 (ref 10–24)
BUN: 18 mg/dL (ref 8–27)
Bilirubin Total: 0.6 mg/dL (ref 0.0–1.2)
CO2: 27 mmol/L (ref 20–29)
Calcium: 9.7 mg/dL (ref 8.6–10.2)
Chloride: 100 mmol/L (ref 96–106)
Creatinine, Ser: 1.03 mg/dL (ref 0.76–1.27)
Globulin, Total: 2.6 g/dL (ref 1.5–4.5)
Glucose: 119 mg/dL — ABNORMAL HIGH (ref 70–99)
Potassium: 4.6 mmol/L (ref 3.5–5.2)
Sodium: 141 mmol/L (ref 134–144)
Total Protein: 7.4 g/dL (ref 6.0–8.5)
eGFR: 82 mL/min/{1.73_m2} (ref >59)

## 2022-01-01 LAB — CBC
Hematocrit: 47.3 % (ref 37.5–51.0)
Hemoglobin: 16.3 g/dL (ref 13.0–17.7)
MCH: 31.8 pg (ref 26.6–33.0)
MCHC: 34.5 g/dL (ref 31.5–35.7)
MCV: 92 fL (ref 79–97)
Platelets: 180 10*3/uL (ref 150–450)
RBC: 5.13 x10E6/uL (ref 4.14–5.80)
RDW: 11.9 % (ref 11.6–15.4)
WBC: 4.7 10*3/uL (ref 3.4–10.8)

## 2022-01-01 LAB — LIPID PANEL
Chol/HDL Ratio: 2.7 ratio (ref 0.0–5.0)
Cholesterol, Total: 95 mg/dL — ABNORMAL LOW (ref 100–199)
HDL: 35 mg/dL — ABNORMAL LOW (ref >39)
LDL Chol Calc (NIH): 43 mg/dL (ref 0–99)
Triglycerides: 85 mg/dL (ref 0–149)
VLDL Cholesterol Cal: 17 mg/dL (ref 5–40)

## 2022-01-01 LAB — TSH: TSH: 2.15 u[IU]/mL (ref 0.450–4.500)

## 2022-01-01 LAB — HEMOGLOBIN A1C
Est. average glucose Bld gHb Est-mCnc: 131 mg/dL
Hgb A1c MFr Bld: 6.2 % — ABNORMAL HIGH (ref 4.8–5.6)

## 2022-01-01 NOTE — Patient Instructions (Signed)
Medication Instructions:  NO CHANGES  *If you need a refill on your cardiac medications before your next appointment, please call your pharmacy*   Lab Work: Lipid Panel, CMET, CBC, TSH, A1c, CBC  If you have labs (blood work) drawn today and your tests are completely normal, you will receive your results only by: MyChart Message (if you have MyChart) OR A paper copy in the mail If you have any lab test that is abnormal or we need to change your treatment, we will call you to review the results.   Testing/Procedures: NONE   Follow-Up: At [facility], you and your health needs are our priority.  As part of our continuing mission to provide you with exceptional heart care, we have created designated Provider Care Teams.  These Care Teams include your primary Cardiologist (physician) and Advanced Practice Providers (APPs -  Physician Assistants and Nurse Practitioners) who all work together to provide you with the care you need, when you need it.  We recommend signing up for the patient portal called "MyChart".  Sign up information is provided on this After Visit Summary.  MyChart is used to connect with patients for Virtual Visits (Telemedicine).  Patients are able to view lab/test results, encounter notes, upcoming appointments, etc.  Non-urgent messages can be sent to your provider as well.   To learn more about what you can do with MyChart, go to [email or web address].    Your next appointment:   8-9 months with Dr. Tresa Endo

## 2022-01-01 NOTE — Progress Notes (Signed)
Patient ID: Bruce [name], male   DOB: 1960-12-24, 61 y.o.   MRN: [id number]   Primary M.D.: Dr. Bronson [name]  HPI: [name] is a 57 white male presents to the office for a 14 month follow-up cardiology evaluation.  [name] a strong family history for coronary artery disease and denies any known CAD or episodes of chest pain. He states that over the years he has had slight progression of  cholesterol elevation but has consistently had low good cholesterol levels. He is followed by Dr. Serina [name].  Laboratory August 2013 showed a total cholesterol 178, triglycerides 196, HDL cholesterol 33, non-HDL cholesterol 145 and LDL cholesterol 113. He had never been on lipid lowering therapy and at that time did not take any medications with the exception of aspirin and over-the-counter fish oil.    In 2014, after his mother had suffered a heart attack, he underwent a routine treadmill test.  His was interpreted as negative, adequate Bruce treadmill test and he was able to exercise for 13.7 that workload and achieving AP, rate of 102% of age-predicted maximum heart rate.  He did not have diagnostic ST changes of ischemia.  He underwent an echo Doppler study on 05/17/2013 which revealed normal systolic function.  There was grade 1 diastolic dysfunction.  He had mild thickening of his mitral valve with systolic bowing without definitive prolapse.  Over the past several years, he has continued to be active.  He denies any exertional precipitation of chest pain.  He denies any change in exercise tolerance.  He was started on atorvastatin 20 mg and takes omega-3 fatty acids as well as coenzyme Q10 for hyperlipidemia.  Due to complaints of significant deterioration of his sleep quality with loud snoring and nonrestorative sleep.  I referred him for sleep study  which was done on 07/30/2015.  He was found to have mild obstructive sleep apnea with an AHI overall of 7.3 per hour.  However, he had a severe  positional component with severe sleep apnea with supine position with an AHI of 51.4 per hour.  There was mild oxygen desaturation to a nadir of 86%. There was loud snoring.  He underwent a CPAP titration trial on 08/21/2015 and ultimately his obstructive apnea was improved at a CPAP pressure of 15.  However, with his previous documentation of a marked positional component to his sleep apnea a CPAP auto unit was recommended.  CPAP was instituted on 10/04/2015.  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  When I saw him in January 2017 he was meeting Medicare compliance standards with 100% use.  He notes significantly more energy.  He denies any residual daytime sleepiness.  He is unaware of breakthrough snoring.  He denies any nocturnal palpitations.  I saw him in November 2018 at which time he remained stable and he denied any episodes of chest pain shortness of breath or palpitations.  He was continuing to use CPAP with 100% compliance and is continued to note significant benefit.    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  He has continued to be on losartan 50 mg daily for hypertension.  He continues to be on Zetia 10 mg and omega-3 fatty acid.  Laboratory on  10/15/2017  showed total cholesterol 171, HDL 36, LDL 90, triglycerides 225.  He states that he has some dark chocolate every  day.  He has a history of GERD and intermittently has used omeprazole.    I saw him in December 2019 at which time he remained stable.  He denies any chest pain PND orthopnea.  Earlier this year he had issues with left hip pain and was diagnosed as having trochanteric bursitis.   [facility] is his DME company and he uses CPAP with 100% compliance.  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   He was evaluated   in a telemedicine visit in June 2021.  At a prior evaluation, I recommended switching losartan to olmesartan 20 mg.  His blood pressure had improved but blood pressure typically was running in the 130s to 140s.  At that telemedicine visit I recommended further titration of olmesartan to 40 mg daily.  He continues to be on Zetia and omega-3 fatty acids for hyperlipidemia.  In December 2019 and LDL cholesterol was 88.    I last saw him on November 15, 2020.  Since his prior evaluation he had  issues with development of a kidney stone.  This was unsuccessfully treated with lithotripsy and he ultimately required cystoscopy with right uteroscopy and laser lithotripsy with right JJ stent placement and a 1.3 cm right distal ureteral calculus was removed.  Since that time he has significantly changed his diet.  He gave up sodas which contain significant sugar.  He lost 15 pounds.  He also required injection into his left hip.  And also he developed discomfort at a previous L5-S1 mini discectomy for which she required injection.  Presently he denies chest pain or shortness of breath.  He has a strong family history for premature coronary artery disease in family members with a father having had an MI at age 44 and subsequently had CABG surgery x3 as well as stents and a defibrillator.  He has continued to use CPAP therapy with 100% compliance and has an old CPAP machine which is still functional.  With a strong family history of CAD we discussed obtaining a coronary calcium score for additional risk stratification.  Over the past year, he has remained stable.  He underwent a coronary calcium score on November 22, 2020 which was 57.7 agatston units with mild calcification in the LAD.  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  His blood pressure has been stable and he continues to be on olmesartan 40 mg.  He is on rosuvastatin 10 mg and Zetia 10 mg for hyperlipidemia.  He takes meloxicam on a as needed basis  with hip discomfort intermittently and takes a baby aspirin.  He denies any chest pain or shortness of breath.  He continues to use CPAP with 100% compliance.  He has an old S9 AutoSet unit with set up date from November 2016.  A download was obtained from January through eighth through January 01, 2022 which confirms 100% compliance.  His pressure ranges at 8 to 20 cm.  AHI is 6.0.  His 95th percentile pressure 16.1 with maximum average pressure of 18.0.  He presents for evaluation.  Past Medical History:  Diagnosis Date   Arthritis    not diagnosed. hip   GERD (gastroesophageal reflux disease)    History of kidney stones    Hypertension    Sleep apnea    wears cpap. 4.5 is one setting    Past Surgical History:  Procedure Laterality Date   CYSTOSCOPY/URETEROSCOPY/HOLMIUM LASER/STENT PLACEMENT Right 05/25/2020   Procedure: CYSTOSCOPY/RETROGRADE/URETEROSCOPY/HOLMIUM LASER/STENT PLACEMENT;  Surgeon: Ceasar Mons, MD;  Location: [facility];  Service: Urology;  Laterality: Right;   EXTRACORPOREAL SHOCK WAVE LITHOTRIPSY Right 03/05/2020   Procedure: EXTRACORPOREAL SHOCK WAVE LITHOTRIPSY (ESWL);  Surgeon: Ceasar Mons, MD;  Location: [facility];  Service: Urology;  Laterality: Right;   ruptured disc  2002   Dr. Marya Amsler YATES L5 MICRODIS   TONSILLECTOMY     as child    No Known Allergies  Current Outpatient Medications  Medication Sig Dispense Refill   aspirin EC 81 MG tablet Take 81 mg by mouth daily. Swallow whole.     ezetimibe (ZETIA) 10 MG tablet TAKE 1 TABLET BY MOUTH EVERY DAY **MUST KEEP APPT FOR REFILLS** 60 tablet 9   fish oil-omega-3 fatty acids 1000 MG capsule Take by mouth daily. 1200 MG CAPS. Takes 4 daily.     meloxicam (MOBIC) 15 MG tablet TAKE 1 TABLET BY MOUTH EVERY DAY WITH FOOD 30 tablet 0   olmesartan (BENICAR) 40 MG tablet TAKE 1 TABLET (40 MG TOTAL) BY MOUTH DAILY. NEED OFFICE VISIT 90 tablet 3   rosuvastatin  (CRESTOR) 10 MG tablet TAKE 1 TABLET BY MOUTH EVERY DAY 90 tablet 3   No current facility-administered medications for this visit.    Socially, he completed 12th grade education. He is married for 35 years the he works for [employer]. He is the owner and in Press photographer. He exercises approximately 2 times per week doing he routinely does not do cardiac or aerobic workouts. He never smoked tobacco. He denies alcohol use.  He continues to be active raising and showing [place].  Family History  Problem Relation Age of Onset   Heart attack Mother    Hyperlipidemia Mother    Hypertension Mother    Heart attack Father    Hyperlipidemia Father    Colon cancer Maternal Grandmother    Heart attack Paternal Grandmother    Heart disease Paternal Grandmother    Heart attack Paternal Grandfather    Heart disease Paternal Grandfather     ROS General: Negative; No fevers, chills, or night sweats;  HEENT: Negative; No changes in vision or hearing, sinus congestion, difficulty swallowing Pulmonary: Negative; No cough, wheezing, shortness of breath, hemoptysis Cardiovascular: Negative; No chest pain, presyncope, syncope, palpitations GI: Negative; No nausea, vomiting, diarrhea, or abdominal pain GU: Recent kidney stone Musculoskeletal: Recent left hip injection Hematologic/Oncology: Negative; no easy bruising, bleeding Endocrine: Negative; no heat/cold intolerance; no diabetes Neuro: Recent L5-S1 injection Skin: Negative; No rashes or skin lesions Psychiatric: Negative; No behavioral problems, depression Sleep: Positive for obstructive sleep apnea, on CPAP therapy with resolution of prior nonrestorative sleep, snoring, and daytime sleepiness;  no bruxism, restless legs, hypnogognic hallucinations, no cataplexy Other comprehensive 14 point system review is negative.   PE BP 124/78    Pulse 71    Ht 6' 5" (1.956 m)    Wt 237 lb 9.6 oz (107.8 kg)    SpO2 99%    BMI  28.18 kg/m    Repeat blood pressure by me was 122/80  Wt Readings from Last 3 Encounters:  01/01/22 237 lb 9.6 oz (107.8 kg)  11/15/20 226 lb (102.5 kg)  05/25/20 225 lb 1.6 oz (102.1 kg)    General: Alert, oriented, no distress.  Skin: normal turgor, no rashes, warm and dry HEENT: Normocephalic, atraumatic. Pupils equal round  and reactive to light; sclera anicteric; extraocular muscles intact; Nose without nasal septal hypertrophy Mouth/Parynx benign; Mallinpatti scale 3 Neck: No JVD, no carotid bruits; normal carotid upstroke Lungs: clear to ausculatation and percussion; no wheezing or rales Chest wall: without tenderness to palpitation Heart: PMI not displaced, RRR, s1 s2 normal, 1/6 systolic murmur, no diastolic murmur, no rubs, gallops, thrills, or heaves Abdomen: soft, nontender; no hepatosplenomehaly, BS+; abdominal aorta nontender and not dilated by palpation. Back: no CVA tenderness Pulses 2+ Musculoskeletal: full range of motion, normal strength, no joint deformities Extremities: no clubbing cyanosis or edema, Homan's sign negative  Neurologic: grossly nonfocal; Cranial nerves grossly wnl Psychologic: Normal mood and affect    January 01, 2022 ECG (independently read by me):  NSR at 71, no ectopy, normal intervals   November 15, 2020 ECG (independently read by me): NSR at 84, no significant ST changes; no ectopy  December 2019 ECG (independently read by me): Normal sinus rhythm at 71 bpm.  No ectopy.  Normal intervals.  November 2018 ECG (independently read by me): normal sinus rhythm at 62 bpm  November 2017 ECG (independently read by me): Normal sinus rhythm at 70 bpm.  September 2016 ECG (independently read by me): Sinus rhythm at 66 bpm.  No ectopy.  Normal intervals.  June 2016 ECG (independently read by me):  Normal sinus rhythm at 68 bpm.  Normal intervals.  Prior ECG: Sinus bradycardia at 59 beats per minute. PR interval 178 ms, QTc interval 403  ms.  LABS: Blood work from [facility] on 09/18/2016 was reviewed.   Hemoglobin 16.8, hematocrit 47.6.  Fasting glucose 120.  BUN 16, Cr1.09.  LFTs normal with an  an AST of 26 and ALT of 31.  Lipid studies revealed total cholesterol 180, triglycerides 194, HDL 32, LDL 109.  Most recent blood work from 10/15/2017 was reviewed.  Total cholesterol 171, HDL 36, LDL 90, triglycerides 225.  Hemoglobin was a 5.6.  Hemoglobin 15.4.  Creatinine 0.96.  TSH 2.5.  ALT 37.  Laboratory from Apr 25, 2020 by Dr. Lona Kettle was reviewed.  Total cholesterol 136, LDL 83, HDL 33, triglycerides 109.  Hemoglobin A1c from December 2019 5.7.  Creatinine from May 25, 2020 was 1.0.   BMP Latest Ref Rng & Units 01/01/2022 05/25/2020 05/20/2020  Glucose 70 - 99 mg/dL 119(H) 122(H) 141(H)  BUN 8 - 27 mg/dL 18 23(H) 20  Creatinine 0.76 - 1.27 mg/dL 1.03 1.00 1.19  BUN/Creat Ratio 10 - 24 17 - -  Sodium 134 - 144 mmol/L 141 141 140  Potassium 3.5 - 5.2 mmol/L 4.6 4.3 4.4  Chloride 96 - 106 mmol/L 100 103 104  CO2 20 - 29 mmol/L 27 - 27  Calcium 8.6 - 10.2 mg/dL 9.7 - 9.5   Hepatic Function Latest Ref Rng & Units 01/01/2022 03/03/2020 11/11/2018  Total Protein 6.0 - 8.5 g/dL 7.4 7.4 6.9  Albumin 3.8 - 4.8 g/dL 4.8 4.1 -  AST 0 - 40 IU/L 38 32 28  ALT 0 - 44 IU/L 48(H) 52(H) 46  Alk Phosphatase 44 - 121 IU/L 51 47 -  Total Bilirubin 0.0 - 1.2 mg/dL 0.6 1.0 0.5  Bilirubin, Direct <=0.2 mg/dL - - -   CBC Latest Ref Rng & Units 01/01/2022 05/25/2020 05/20/2020  WBC 3.4 - 10.8 x10E3/uL 4.7 - 13.3(H)  Hemoglobin 13.0 - 17.7 g/dL 16.3 16.3 16.1  Hematocrit 37.5 - 51.0 % 47.3 48.0 47.0  Platelets 150 - 450 x10E3/uL 180 - 211  Lab Results  Component Value Date   MCV 92 01/01/2022   MCV 93.1 05/20/2020   MCV 91.8 03/03/2020   Lab Results  Component Value Date   TSH 2.150 01/01/2022   Lab Results  Component Value Date   HGBA1C 6.2 (H) 01/01/2022     Lipid Panel     Component Value Date/Time   CHOL 95  (L) 01/01/2022 1032   CHOL 166 05/25/2013 0855   TRIG 85 01/01/2022 1032   TRIG 179 (H) 05/25/2013 0855   HDL 35 (L) 01/01/2022 1032   HDL 33 (L) 05/25/2013 0855   CHOLHDL 2.7 01/01/2022 1032   CHOLHDL 4.1 11/11/2018 1059   VLDL 33 (H) 04/28/2017 0908   LDLCALC 43 01/01/2022 1032   LDLCALC 88 11/11/2018 1059   LDLCALC 97 05/25/2013 0855   IMPRESSION:  1. Primary hypertension   2. OSA (obstructive sleep apnea)   3. Hyperlipidemia with target LDL less than 100   4. Agatston coronary artery calcium score less than 100   5. Family history of premature CAD     ASSESSMENT AND PLAN:  [name] is a 61 year-old Caucasian male who has a strong family history for coronary artery disease with both parents having had heart attacks. His father suffered his initial heart attack at age 39 and subsequently had CABG surgery x3 and also has stents as well as a defibrillator.  In 2014, he had a negative adequate graded exercise treadmill test which was done after his mother had suffered a heart attack.  An echo Doppler study showed normal systolic function with mild diastolic relaxation abnormality and a mildly thickened mitral valve with systolic bowing without definitive prolapse.  [place] 19, I switched him from losartan to olmesartan 20 mg and at his most recent telemedicine evaluation in June 2020 with blood pressure still in the upper 130s to 140 range further titration to 40 mg was done.  He has noted improvement in blood pressure since being on the olmesartan 40 mg.  He has been on Zetia 10 mg daily.  Most recent LDL cholesterol in May 2021 was 83.  When I saw him in December 2021, with his strong family history for CAD I recommended he undergo coronary calcium score.  His score was 57.7 agatston units and there was mild calcification in the LAD.  He remains asymptomatic without chest pain or shortness of breath.  He has continued to be on lipid-lowering therapy with target LDL less than 70  particularly with evidence for mild coronary calcification.  He is on Zetia 10 mg and rosuvastatin 10 mg.  Blood pressure today is excellent and on repeat by me was 122/80 on olmesartan 40 mg.  He continues to use CPAP with 100% compliance.  I reviewed his download.  I will change his pressure settings to a range of 11 to 20 cm instead of 820 with his 95th percentile pressure at 16.1.  He qualifies for a new machine and I will order him a ResMed air sense 11 CPAP auto unit.  I discussed with him due to supply chain issues there may be a 4 to 18-monthwait.  I am also recommending fasting laboratory with a comprehensive metabolic panel, CBC TSH hemoglobin A1c and lipid studies.  I will see him in approximately 8 months for follow-up evaluation and hopefully at that time he would have received his new CPAP machine.   TTroy Sine MD, FOphthalmology Associates LLC2/07/2022 7:00 AM

## 2022-01-08 ENCOUNTER — Encounter: Payer: Self-pay | Admitting: Cardiovascular Disease

## 2022-03-20 ENCOUNTER — Other Ambulatory Visit: Payer: Self-pay | Admitting: Cardiovascular Disease

## 2022-03-27 DIAGNOSIS — G4733 Obstructive sleep apnea (adult) (pediatric): Secondary | ICD-10-CM | POA: Diagnosis not present

## 2022-04-26 DIAGNOSIS — G4733 Obstructive sleep apnea (adult) (pediatric): Secondary | ICD-10-CM | POA: Diagnosis not present

## 2022-05-14 DIAGNOSIS — Z125 Encounter for screening for malignant neoplasm of prostate: Secondary | ICD-10-CM | POA: Diagnosis not present

## 2022-05-14 DIAGNOSIS — E559 Vitamin D deficiency, unspecified: Secondary | ICD-10-CM | POA: Diagnosis not present

## 2022-05-14 DIAGNOSIS — Z0000 Encounter for general adult medical examination without abnormal findings: Secondary | ICD-10-CM | POA: Diagnosis not present

## 2022-05-14 DIAGNOSIS — Z13220 Encounter for screening for lipoid disorders: Secondary | ICD-10-CM | POA: Diagnosis not present

## 2022-05-14 DIAGNOSIS — R7301 Impaired fasting glucose: Secondary | ICD-10-CM | POA: Diagnosis not present

## 2022-05-21 DIAGNOSIS — Z0000 Encounter for general adult medical examination without abnormal findings: Secondary | ICD-10-CM | POA: Diagnosis not present

## 2022-05-27 DIAGNOSIS — L570 Actinic keratosis: Secondary | ICD-10-CM | POA: Diagnosis not present

## 2022-05-27 DIAGNOSIS — G4733 Obstructive sleep apnea (adult) (pediatric): Secondary | ICD-10-CM | POA: Diagnosis not present

## 2022-05-27 DIAGNOSIS — X32XXXD Exposure to sunlight, subsequent encounter: Secondary | ICD-10-CM | POA: Diagnosis not present

## 2022-06-26 DIAGNOSIS — G4733 Obstructive sleep apnea (adult) (pediatric): Secondary | ICD-10-CM | POA: Diagnosis not present

## 2022-07-23 ENCOUNTER — Ambulatory Visit (INDEPENDENT_AMBULATORY_CARE_PROVIDER_SITE_OTHER): Payer: BC Managed Care – PPO | Admitting: Cardiovascular Disease

## 2022-07-23 ENCOUNTER — Encounter: Payer: Self-pay | Admitting: Cardiovascular Disease

## 2022-07-23 DIAGNOSIS — G4733 Obstructive sleep apnea (adult) (pediatric): Secondary | ICD-10-CM

## 2022-07-23 DIAGNOSIS — Z8249 Family history of ischemic heart disease and other diseases of the circulatory system: Secondary | ICD-10-CM

## 2022-07-23 DIAGNOSIS — E785 Hyperlipidemia, unspecified: Secondary | ICD-10-CM

## 2022-07-23 DIAGNOSIS — I10 Essential (primary) hypertension: Secondary | ICD-10-CM | POA: Diagnosis not present

## 2022-07-23 DIAGNOSIS — R931 Abnormal findings on diagnostic imaging of heart and coronary circulation: Secondary | ICD-10-CM | POA: Diagnosis not present

## 2022-07-23 NOTE — Progress Notes (Addendum)
Patient ID: Bruce [name], male   DOB: [birth year], 61 y.o.   MRN: [id number]   Primary M.D.: Dr. Bronson Ing  HPI: [name] is a 59 white male presents to the office for a 6 month follow-up cardiology evaluation.  [name] a strong family history for coronary artery disease and denies any known CAD or episodes of chest pain. He states that over the years he has had slight progression of  cholesterol elevation but has consistently had low good cholesterol levels. He is followed by Dr. Serina Cowper.  Laboratory August 2013 showed a total cholesterol 178, triglycerides 196, HDL cholesterol 33, non-HDL cholesterol 145 and LDL cholesterol 113. He had never been on lipid lowering therapy and at that time did not take any medications with the exception of aspirin and over-the-counter fish oil.    In 2014, after his mother had suffered a heart attack, he underwent a routine treadmill test.  His was interpreted as negative, adequate Bruce treadmill test and he was able to exercise for 13.7 that workload and achieving AP, rate of 102% of age-predicted maximum heart rate.  He did not have diagnostic ST changes of ischemia.  He underwent an echo Doppler study on 05/17/2013 which revealed normal systolic function.  There was grade 1 diastolic dysfunction.  He had mild thickening of his mitral valve with systolic bowing without definitive prolapse.  Over the past several years, he has continued to be active.  He denies any exertional precipitation of chest pain.  He denies any change in exercise tolerance.  He was started on atorvastatin 20 mg and takes omega-3 fatty acids as well as coenzyme Q10 for hyperlipidemia.  Due to complaints of significant deterioration of his sleep quality with loud snoring and nonrestorative sleep.  I referred him for sleep study  which was done on 07/30/2015.  He was found to have mild obstructive sleep apnea with an AHI overall of 7.3 per hour.  However, he had a severe  positional component with severe sleep apnea with supine position with an AHI of 51.4 per hour.  There was mild oxygen desaturation to a nadir of 86%. There was loud snoring.  He underwent a CPAP titration trial on 08/21/2015 and ultimately his obstructive apnea was improved at a CPAP pressure of 15.  However, with his previous documentation of a marked positional component to his sleep apnea a CPAP auto unit was recommended.  CPAP was instituted on 10/04/2015.  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  When I saw him in January 2017 he was meeting Medicare compliance standards with 100% use.  He notes significantly more energy.  He denies any residual daytime sleepiness.  He is unaware of breakthrough snoring.  He denies any nocturnal palpitations.  I saw him in November 2018 at which time he remained stable and he denied any episodes of chest pain shortness of breath or palpitations.  He was continuing to use CPAP with 100% compliance and is continued to note significant benefit.    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  He has continued to be on losartan 50 mg daily for hypertension.  He continues to be on Zetia 10 mg and omega-3 fatty acid.  Laboratory on  10/15/2017  showed total cholesterol 171, HDL 36, LDL 90, triglycerides 225.  He states that he has some dark chocolate every  day.  He has a history of GERD and intermittently has used omeprazole.    I saw him in December 2019 at which time he remained stable.  He denies any chest pain PND orthopnea.  Earlier this year he had issues with left hip pain and was diagnosed as having trochanteric bursitis.   [facility] is his DME company and he uses CPAP with 100% compliance.  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   He was evaluated   in a telemedicine visit in June 2021.  At a prior evaluation, I recommended switching losartan to olmesartan 20 mg.  His blood pressure had improved but blood pressure typically was running in the 130s to 140s.  At that telemedicine visit I recommended further titration of olmesartan to 40 mg daily.  He continues to be on Zetia and omega-3 fatty acids for hyperlipidemia.  In December 2019 and LDL cholesterol was 88.    I saw him on November 15, 2020.  Since his prior evaluation he had  issues with development of a kidney stone.  This was unsuccessfully treated with lithotripsy and he ultimately required cystoscopy with right uteroscopy and laser lithotripsy with right JJ stent placement and a 1.3 cm right distal ureteral calculus was removed.  Since that time he has significantly changed his diet.  He gave up sodas which contain significant sugar.  He lost 15 pounds.  He also required injection into his left hip.  And also he developed discomfort at a previous L5-S1 mini discectomy for which she required injection.  Presently he denies chest pain or shortness of breath.  He has a strong family history for premature coronary artery disease in family members with a father having had an MI at age 36 and subsequently had CABG surgery x3 as well as stents and a defibrillator.  He has continued to use CPAP therapy with 100% compliance and has an old CPAP machine which is still functional.  With a strong family history of CAD we discussed obtaining a coronary calcium score for additional risk stratification.  I last saw him on January 01, 2022.  Over the prior year he had remained stable.  [name]  He underwent a coronary calcium score on November 22, 2020 which was 57.7 agatston units with mild calcification in the LAD.  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  His blood pressure has been stable and he continues to be on olmesartan 40 mg.  He is on rosuvastatin 10 mg and Zetia 10 mg for hyperlipidemia.  He  takes meloxicam on a as needed basis with hip discomfort intermittently and takes a baby aspirin.  He denies any chest pain or shortness of breath.  He continues to use CPAP with 100% compliance.  He has an old S9 AutoSet unit with set up date from November 2016.  A download was obtained from January through January 01, 2022 which confirmed 100% compliance.  His pressure ranges at 8 to 20 cm.  AHI is 6.0.  His 95th percentile pressure 16.1 with maximum average pressure of 18.0.  That evaluation, he qualified for a new machine.  I change his pressure settings to a range of 11 to 20 cm with his 95th percentile pressure at 16.1.  I ordered him a new ResMed AirSense 11 CPAP auto unit.  Mr. Cullipher received a new ResMed AirSense  11 AutoSet unit on March 27, 2022.  I was able to obtain a download from April 27 through July 22, 2022.  Compliance is excellent with 100% use and average use at 7 hours and 32 minutes.  At his pressure setting of 11 to 20 cm, AHI is 4.6/h.  His 95th percentile pressure is 15.6 and maximum average pressure 17.3 cm of water.   I calculated an Epworth Sleepiness Scale score in the office today and this endorsed at 4 arguing against residual daytime sleepiness.  He continues to be on olmesartan 40 mg for hypertension.  He is on Zetia 10 mg and rosuvastatin 10 mg for hyperlipidemia in addition to over-the-counter fish oil.  He is on baby aspirin.  He presents for reevaluation.  Past Medical History:  Diagnosis Date   Arthritis    not diagnosed. hip   GERD (gastroesophageal reflux disease)    History of kidney stones    Hypertension    Sleep apnea    wears cpap. 4.5 is one setting    Past Surgical History:  Procedure Laterality Date   CYSTOSCOPY/URETEROSCOPY/HOLMIUM LASER/STENT PLACEMENT Right 05/25/2020   Procedure: CYSTOSCOPY/RETROGRADE/URETEROSCOPY/HOLMIUM LASER/STENT PLACEMENT;  Surgeon: Ceasar Mons, MD;  Location: [facility];  Service: Urology;   Laterality: Right;   EXTRACORPOREAL SHOCK WAVE LITHOTRIPSY Right 03/05/2020   Procedure: EXTRACORPOREAL SHOCK WAVE LITHOTRIPSY (ESWL);  Surgeon: Ceasar Mons, MD;  Location: [facility];  Service: Urology;  Laterality: Right;   ruptured disc  2002   Dr. Marya Amsler YATES L5 MICRODIS   TONSILLECTOMY     as child    No Known Allergies  Current Outpatient Medications  Medication Sig Dispense Refill   aspirin EC 81 MG tablet Take 81 mg by mouth daily. Swallow whole.     ezetimibe (ZETIA) 10 MG tablet TAKE 1 TABLET BY MOUTH EVERY DAY **MUST KEEP APPT FOR REFILLS** 90 tablet 2   fish oil-omega-3 fatty acids 1000 MG capsule Take by mouth daily. 1200 MG CAPS. Takes 4 daily.     meloxicam (MOBIC) 15 MG tablet TAKE 1 TABLET BY MOUTH EVERY DAY WITH FOOD 30 tablet 0   olmesartan (BENICAR) 40 MG tablet TAKE 1 TABLET (40 MG TOTAL) BY MOUTH DAILY. NEED OFFICE VISIT 90 tablet 3   rosuvastatin (CRESTOR) 10 MG tablet TAKE 1 TABLET BY MOUTH EVERY DAY 90 tablet 3   No current facility-administered medications for this visit.    Socially, he completed 12th grade education. He is married for 35 years the he works for [employer]. He is the owner and in Press photographer. He exercises approximately 2 times per week doing he routinely does not do cardiac or aerobic workouts. He never smoked tobacco. He denies alcohol use.  He continues to be active raising and showing [place].  Family History  Problem Relation Age of Onset   Heart attack Mother    Hyperlipidemia Mother    Hypertension Mother    Heart attack Father    Hyperlipidemia Father    Colon cancer Maternal Grandmother    Heart attack Paternal Grandmother    Heart disease Paternal Grandmother    Heart attack Paternal Grandfather    Heart disease Paternal Grandfather     ROS General: Negative; No fevers, chills, or night sweats;  HEENT: Negative; No changes in vision or hearing, sinus congestion,  difficulty swallowing Pulmonary: Negative; No cough, wheezing, shortness of breath, hemoptysis Cardiovascular: Negative; No chest pain, presyncope, syncope, palpitations GI:  Negative; No nausea, vomiting, diarrhea, or abdominal pain GU: Recent kidney stone Musculoskeletal: Recent left hip injection Hematologic/Oncology: Negative; no easy bruising, bleeding Endocrine: Negative; no heat/cold intolerance; no diabetes Neuro: Recent L5-S1 injection Skin: Negative; No rashes or skin lesions Psychiatric: Negative; No behavioral problems, depression Sleep: Positive for obstructive sleep apnea, on CPAP therapy with resolution of prior nonrestorative sleep, snoring, and daytime sleepiness;  no bruxism, restless legs, hypnogognic hallucinations, no cataplexy Other comprehensive 14 point system review is negative.   PE BP 130/88   Pulse 74   Ht 6' 5" (1.956 m)   Wt 232 lb (105.2 kg)   SpO2 99%   BMI 27.51 kg/m    Repeat blood pressure by me 126/80  Wt Readings from Last 3 Encounters:  07/23/22 232 lb (105.2 kg)  01/01/22 237 lb 9.6 oz (107.8 kg)  11/15/20 226 lb (102.5 kg)   General: Alert, oriented, no distress.  Skin: normal turgor, no rashes, warm and dry HEENT: Normocephalic, atraumatic. Pupils equal round and reactive to light; sclera anicteric; extraocular muscles intact;  Nose without nasal septal hypertrophy Mouth/Parynx benign; Mallinpatti scale 3 Neck: No JVD, no carotid bruits; normal carotid upstroke Lungs: clear to ausculatation and percussion; no wheezing or rales Chest wall: without tenderness to palpitation Heart: PMI not displaced, RRR, s1 s2 normal, 1/6 systolic murmur, no diastolic murmur, no rubs, gallops, thrills, or heaves Abdomen: soft, nontender; no hepatosplenomehaly, BS+; abdominal aorta nontender and not dilated by palpation. Back: no CVA tenderness Pulses 2+ Musculoskeletal: full range of motion, normal strength, no joint deformities Extremities: no  clubbing cyanosis or edema, Homan's sign negative  Neurologic: grossly nonfocal; Cranial nerves grossly wnl Psychologic: Normal mood and affect  July 23, 2022 ECG (independently read by me):  NSR at 74, no ectopy,normalintervals   January 01, 2022 ECG (independently read by me):  NSR at 71, no ectopy, normal intervals   November 15, 2020 ECG (independently read by me): NSR at 84, no significant ST changes; no ectopy  December 2019 ECG (independently read by me): Normal sinus rhythm at 71 bpm.  No ectopy.  Normal intervals.  November 2018 ECG (independently read by me): normal sinus rhythm at 62 bpm  November 2017 ECG (independently read by me): Normal sinus rhythm at 70 bpm.  September 2016 ECG (independently read by me): Sinus rhythm at 66 bpm.  No ectopy.  Normal intervals.  June 2016 ECG (independently read by me):  Normal sinus rhythm at 68 bpm.  Normal intervals.  Prior ECG: Sinus bradycardia at 59 beats per minute. PR interval 178 ms, QTc interval 403 ms.  LABS: Blood work from [facility] on 09/18/2016 was reviewed.   Hemoglobin 16.8, hematocrit 47.6.  Fasting glucose 120.  BUN 16, Cr1.09.  LFTs normal with an  an AST of 26 and ALT of 31.  Lipid studies revealed total cholesterol 180, triglycerides 194, HDL 32, LDL 109.  Most recent blood work from 10/15/2017 was reviewed.  Total cholesterol 171, HDL 36, LDL 90, triglycerides 225.  Hemoglobin was a 5.6.  Hemoglobin 15.4.  Creatinine 0.96.  TSH 2.5.  ALT 37.  Laboratory from Apr 25, 2020 by Dr. Lona Kettle was reviewed.  Total cholesterol 136, LDL 83, HDL 33, triglycerides 109.  Hemoglobin A1c from December 2019 5.7.  Creatinine from May 25, 2020 was 1.0.  Recent laboratory work from May 14, 2022 was reviewed: Total cholesterol 103, triglycerides 97, LDL 52, HDL 33.  Creatinine 1.02.  TSH was 2.15 on January 01, 2022.       Latest Ref Rng & Units 01/01/2022   10:32 AM 05/25/2020    1:19 PM 05/20/2020    6:34 PM   BMP  Glucose 70 - 99 mg/dL 119  122  141   BUN 8 - 27 mg/dL _0 Creatinine 0.76 - 1.27 mg/dL 1.03  1.00  1.19   BUN/Creat Ratio 10 - 24 17     Sodium 134 - 144 mmol/L 141  141  140   Potassium 3.5 - 5.2 mmol/L 4.6  4.3  4.4   Chloride 96 - 106 mmol/L 100  103  104   CO2 20 - 29 mmol/L 27   27   Calcium 8.6 - 10.2 mg/dL 9.7   9.5       Latest Ref Rng & Units 01/01/2022   10:32 AM 03/03/2020    6:13 PM 11/11/2018   10:59 AM  Hepatic Function  Total Protein 6.0 - 8.5 g/dL 7.4  7.4  6.9   Albumin 3.8 - 4.8 g/dL 4.8  4.1    AST 0 - 40 IU/L 38  32  28   ALT 0 - 44 IU/L 48  52  46   Alk Phosphatase 44 - 121 IU/L 51  47    Total Bilirubin 0.0 - 1.2 mg/dL 0.6  1.0  0.5       Latest Ref Rng & Units 01/01/2022   10:32 AM 05/25/2020    1:19 PM 05/20/2020    6:34 PM  CBC  WBC 3.4 - 10.8 x10E3/uL 4.7   13.3   Hemoglobin 13.0 - 17.7 g/dL 16.3  16.3  16.1   Hematocrit 37.5 - 51.0 % 47.3  48.0  47.0   Platelets 150 - 450 x10E3/uL 180   211    Lab Results  Component Value Date   MCV 92 01/01/2022   MCV 93.1 05/20/2020   MCV 91.8 03/03/2020   Lab Results  Component Value Date   TSH 2.150 01/01/2022   Lab Results  Component Value Date   HGBA1C 6.2 (H) 01/01/2022     Lipid Panel     Component Value Date/Time   CHOL 95 (L) 01/01/2022 1032   CHOL 166 05/25/2013 0855   TRIG 85 01/01/2022 1032   TRIG 179 (H) 05/25/2013 0855   HDL 35 (L) 01/01/2022 1032   HDL 33 (L) 05/25/2013 0855   CHOLHDL 2.7 01/01/2022 1032   CHOLHDL 4.1 11/11/2018 1059   VLDL 33 (H) 04/28/2017 0908   LDLCALC 43 01/01/2022 1032   LDLCALC 88 11/11/2018 1059   LDLCALC 97 05/25/2013 0855   IMPRESSION:  1. Primary hypertension   2. OSA (obstructive sleep apnea)   3. Hyperlipidemia with target LDL less than 100   4. Agatston coronary artery calcium score less than 100   5. Family history of premature CAD     ASSESSMENT AND PLAN:  [name] is a 61 year-old Caucasian male who has a strong  family history for coronary artery disease with both parents having had heart attacks. His father suffered his initial heart attack at age 66 and subsequently had CABG surgery x3 and also has stents as well as a defibrillator.  In 2014, he had a negative adequate graded exercise treadmill test which was done after his mother had suffered a heart attack.  An echo Doppler study showed normal systolic function with mild diastolic relaxation abnormality and a mildly thickened mitral valve  with systolic bowing without definitive prolapse.  In 2019 I switched him from losartan to olmesartan 20 mg and at his telemedicine evaluation in June 2020 with blood pressure still in the upper 130s to 140 range further titration to 40 mg was done.  He has noted improvement in blood pressure since being on the olmesartan 40 mg.  He has been on Zetia 10 mg daily.  Most recent LDL cholesterol in May 2021 was 83.  When I saw him in December 2021, with his strong family history for CAD I recommended he undergo coronary calcium score.  His score was 57.7 agatston units and there was mild calcification in the LAD.  Presently, today blood pressure is excellent and on repeat by me was 126/80 on olmesartan 40 mg.  Lipid studies from May 14, 2022 were excellent with total cholesterol 103 LDL cholesterol 52 triglycerides 97 although HDL remains low at 33 on Zetia 10 mg and rosuvastatin 10 mg and over-the-counter omega-3 fatty acid.  He is using CPAP with 100% compliance and received a new ResMed AirSense 11 CPAP auto unit on March 27, 2022 with [facility] as his DME company.  His current settings are 11 to 20 cm of water.  AHI is 4.6 and his 95th percentile pressure is 15.6 with maximum average pressure 17.3.  I will make slight adjustment to his settings and will change his pressure range to 13 to 20 cm of water.  Clinically he is doing well.  He is stable.  I will see him in 1 year for reevaluation or sooner as needed.   Bruce Sine, MD, [facility] 07/23/2022 6:31 PM

## 2022-07-23 NOTE — Patient Instructions (Signed)

## 2022-08-11 DIAGNOSIS — N201 Calculus of ureter: Secondary | ICD-10-CM | POA: Diagnosis not present

## 2022-08-11 DIAGNOSIS — R3913 Splitting of urinary stream: Secondary | ICD-10-CM | POA: Diagnosis not present

## 2022-08-11 DIAGNOSIS — N401 Enlarged prostate with lower urinary tract symptoms: Secondary | ICD-10-CM | POA: Diagnosis not present

## 2022-10-31 ENCOUNTER — Other Ambulatory Visit: Payer: Self-pay | Admitting: Cardiovascular Disease

## 2022-11-04 ENCOUNTER — Telehealth: Payer: Self-pay | Admitting: Physical Medicine and Rehabilitation

## 2022-11-04 NOTE — Telephone Encounter (Signed)
Patient requesting a appt with Dr. Alvester Morin. Want hip injection. Please advise..[phone]

## 2022-11-06 NOTE — Telephone Encounter (Signed)
Spoke with patient and he has left hip pain. Scheduled ov for patient since it has been 2 years. Scheduled for 11/11/22

## 2022-11-10 ENCOUNTER — Other Ambulatory Visit: Payer: Self-pay | Admitting: Cardiovascular Disease

## 2022-11-11 ENCOUNTER — Ambulatory Visit (INDEPENDENT_AMBULATORY_CARE_PROVIDER_SITE_OTHER): Payer: BC Managed Care – PPO | Admitting: Physical Medicine and Rehabilitation

## 2022-11-11 DIAGNOSIS — G8929 Other chronic pain: Secondary | ICD-10-CM | POA: Diagnosis not present

## 2022-11-11 DIAGNOSIS — R1032 Left lower quadrant pain: Secondary | ICD-10-CM | POA: Diagnosis not present

## 2022-11-11 DIAGNOSIS — M25552 Pain in left hip: Secondary | ICD-10-CM | POA: Diagnosis not present

## 2022-11-11 DIAGNOSIS — M1612 Unilateral primary osteoarthritis, left hip: Secondary | ICD-10-CM | POA: Diagnosis not present

## 2022-11-11 NOTE — Progress Notes (Unsigned)
Bruce [name] - 61 y.o. male MRN [id number]  Date of birth: 12-05-1960  Office Visit Note: Visit Date: 11/11/2022 PCP: Daisy Floro, MD Referred by: Daisy Floro, MD  Subjective: Chief Complaint  Patient presents with   Left Hip - Pain   HPI: Bruce [name] is a 61 y.o. male who comes in today for evaluation of chronic, worsening and severe left sided groin pain. Pain ongoing for several years, worsened over the last couple of months. Pain worsens with movement and activity, describes as a sore and catching sensation, currently rates as 6 out of 10. Some relief of pain with home exercise regimen, rest and use of medications. MRI imaging of left hip from 2021 shows moderate osteoarthritis and small joint effusion. Previously treated by Dr. Doneen Poisson in 2017, per his notes he did discuss left hip replacement, however patient elected to continue with conservative treatments at that time. We did perform left intra-articular hip injection in our office in 2021, he reports significant and sustained relief of pain with this procedure. Patient denies focal weakness, numbness and tingling. Patient denies recent trauma or falls.   Of note, we have treated patient in the past for chronic lower back pain. Did perform right S1 transforaminal epidural steroid injection in 2021, lumbar MRI imaging from that time exhibits right subarticular disc extrusion with inferior migration at L5-S1, impinging upon the descending right S1 nerve root. Patient denies lower back issues at this time, states injection helped him significantly.     Review of Systems  Musculoskeletal:  Positive for joint pain.  Neurological:  Negative for tingling, sensory change, focal weakness and weakness.  All other systems reviewed and are negative.  Otherwise per HPI.  Assessment & Plan: Visit Diagnoses:    ICD-10-CM   1. Groin pain, chronic, left  R10.32    G89.29     2. Pain in left hip  M25.552  Ambulatory referral to Physical Medicine Rehab    3. Unilateral primary osteoarthritis, left hip  M16.12 Ambulatory referral to Physical Medicine Rehab       Plan: Findings:  1. Chronic, worsening and severe left sided groin pain.  Patient continues to have severe pain despite good conservative therapy such as home exercise regimen, rest and use of medications.  Patient's clinical presentation and exam seem to be more of an intrinsic left hip issue, consistent with osteoarthritis. Does have pain with internal/external rotation of left hip. Next step is to perform diagnostic and hopefully therapeutic left intra-articular hip injection under fluoroscopic guidance. We will get him in quickly for injection. If good relief of pain with injection we can repeat infrequently as needed. If his pain persists post injection we would recommend follow up with Dr. Magnus Ivan to discuss options. No red flag symptoms noted upon exam today.   2. Chronic lower back pain. He is managing lower back pain well. If lower back pain symptoms return we did discuss repeating lumbar epidural steroid injection.     Meds & Orders: No orders of the defined types were placed in this encounter.   Orders Placed This Encounter  Procedures   Ambulatory referral to Physical Medicine Rehab    Follow-up: Return for left intra-articular hip injection.   Procedures: No procedures performed      Clinical History: EXAM: MRI LUMBAR SPINE WITHOUT CONTRAST   TECHNIQUE: Multiplanar, multisequence MR imaging of the lumbar spine was performed. No intravenous contrast was administered.   COMPARISON:  Prior radiograph from  05/14/2020.   FINDINGS: Segmentation: Standard. Lowest well-formed disc space labeled the L5-S1 level.   Alignment: Mild dextroscoliosis. Trace 2 mm retrolisthesis of L5 on S1. Additional trace retrolisthesis of L1 on L2 and L2 on L3.   Vertebrae: Vertebral body height maintained without acute or  chronic fracture. Bone marrow signal intensity somewhat heterogeneous but within normal limits. Few scattered benign hemangiomata noted. No worrisome osseous lesions. Discogenic reactive endplate changes present about the L5-S1 interspace. No abnormal marrow edema.   Conus medullaris and cauda equina: Conus extends to the T12-L1 level. Conus and cauda equina appear normal.   Paraspinal and other soft tissues: Paraspinous soft tissues within normal limits. Visualized visceral structures are normal.   Disc levels:   T11-12: Disc bulge with disc desiccation. No significant spinal stenosis. Foramina remain patent.   T12-L1: Disc desiccation. Right foraminal to extraforaminal disc protrusion with reactive endplate spurring (series 13, image 4). This closely approximates the exiting nerve root without frank neural impingement. No significant spinal stenosis. Mild right foraminal narrowing.   L1-2: Mild disc bulge with disc desiccation. Shallow right foraminal to extraforaminal disc protrusion closely approximates the exiting right L1 nerve root without impingement or displacement (series 13, image 11). No spinal stenosis. Foramina remain patent.   L2-3: Disc desiccation with mild disc bulge. Mild facet hypertrophy. No canal or foraminal stenosis.   L3-4: Disc desiccation with mild disc bulge. Superimposed small left foraminal disc protrusion (series 10, image 25). This closely approximates the exiting left L3 nerve root without impingement or displacement. Mild facet hypertrophy. No significant spinal stenosis. Foramina remain patent.   L4-5: Disc desiccation with mild disc bulge. Mild reactive endplate change. Superimposed small biforaminal to extraforaminal disc protrusions (series 10, image 32). Associated annular fissures. Moderate bilateral facet hypertrophy. No significant spinal stenosis. Foramina remain patent.   L5-S1: Degenerative intervertebral disc space narrowing  with disc desiccation and diffuse disc bulge. Reactive endplate spurring. Superimposed right subarticular disc extrusion with inferior migration, extending into the right lateral recess and impinging upon the descending right S1 nerve root (series 13, image 38). Mild to moderate bilateral facet hypertrophy. Resultant moderate to severe right lateral recess stenosis. Central canal remains patent. No significant foraminal encroachment.   IMPRESSION: 1. Right subarticular disc extrusion with inferior migration at L5-S1, impinging upon the descending right S1 nerve root. 2. Small biforaminal to extraforaminal disc protrusions at L4-5 without neural impingement. 3. Right foraminal to extraforaminal disc protrusions at T12-L1 and L1-2, closely approximating and potentially irritating the exiting right T12 and L1 nerve roots respectively. 4. Small left foraminal disc protrusion at L3-4, potentially affecting the exiting left L3 nerve root.     Electronically Signed   By: Rise Mu M.D.   On: 10/01/2020 04:54   He reports that he has never smoked. He has never used smokeless tobacco.  Recent Labs    01/01/22 1032  HGBA1C 6.2*    Objective:  VS:  HT:    WT:   BMI:     BP:   HR: bpm  TEMP: ( )  RESP:  Physical Exam Vitals and nursing note reviewed.  HENT:     Head: Normocephalic and atraumatic.     Right Ear: External ear normal.     Left Ear: External ear normal.     Nose: Nose normal.     Mouth/Throat:     Mouth: Mucous membranes are moist.  Eyes:     Extraocular Movements: Extraocular movements intact.  Cardiovascular:  Rate and Rhythm: Normal rate.     Pulses: Normal pulses.  Pulmonary:     Effort: Pulmonary effort is normal.  Abdominal:     General: Abdomen is flat. There is no distension.  Musculoskeletal:        General: Tenderness present.     Cervical back: Normal range of motion.     Comments: Pain noted with internal/external rotation of left  hip. No pain upon palpation of greater trochanters. Strong distal strength without clonus. Sensation intact bilateral lower extremities. Pt has no significant limitations in mobility.   Skin:    General: Skin is warm and dry.     Capillary Refill: Capillary refill takes less than 2 seconds.  Neurological:     General: No focal deficit present.     Mental Status: He is alert and oriented to person, place, and time.  Psychiatric:        Mood and Affect: Mood normal.        Behavior: Behavior normal.     Ortho Exam  Imaging: No results found.  Past Medical/Family/Surgical/Social History: Medications & Allergies reviewed per EMR, new medications updated. Patient Active Problem List   Diagnosis Date Noted   Unilateral primary osteoarthritis, left hip 06/13/2020   Trochanteric bursitis, left hip 01/11/2018   Abnormal glucose level 10/14/2016   Gastroesophageal reflux disease without esophagitis 10/14/2016   Essential hypertension 12/13/2015   OSA (obstructive sleep apnea) 08/29/2015   Hyperlipidemia with target LDL less than 100 04/26/2013   Family history of premature CAD 04/26/2013   Snoring 04/26/2013   Past Medical History:  Diagnosis Date   Arthritis    not diagnosed. hip   GERD (gastroesophageal reflux disease)    History of kidney stones    Hypertension    Sleep apnea    wears cpap. 4.5 is one setting   Family History  Problem Relation Age of Onset   Heart attack Mother    Hyperlipidemia Mother    Hypertension Mother    Heart attack Father    Hyperlipidemia Father    Colon cancer Maternal Grandmother    Heart attack Paternal Grandmother    Heart disease Paternal Grandmother    Heart attack Paternal Grandfather    Heart disease Paternal Grandfather    Past Surgical History:  Procedure Laterality Date   CYSTOSCOPY/URETEROSCOPY/HOLMIUM LASER/STENT PLACEMENT Right 05/25/2020   Procedure: CYSTOSCOPY/RETROGRADE/URETEROSCOPY/HOLMIUM LASER/STENT PLACEMENT;  Surgeon:  Rene Paci, MD;  Location: [facility];  Service: Urology;  Laterality: Right;   EXTRACORPOREAL SHOCK WAVE LITHOTRIPSY Right 03/05/2020   Procedure: EXTRACORPOREAL SHOCK WAVE LITHOTRIPSY (ESWL);  Surgeon: Rene Paci, MD;  Location: [facility];  Service: Urology;  Laterality: Right;   ruptured disc  2002   Dr. Tammy Sours YATES L5 MICRODIS   TONSILLECTOMY     as child   Social History   Occupational History   Not on file  Tobacco Use   Smoking status: Never   Smokeless tobacco: Never  Vaping Use   Vaping Use: Never used  Substance and Sexual Activity   Alcohol use: Never    Alcohol/week: 0.0 standard drinks of alcohol    Comment: rarely has a drink   Drug use: Never   Sexual activity: Yes

## 2022-11-11 NOTE — Progress Notes (Unsigned)
Numeric Pain Rating Scale and Functional Assessment Average Pain 0   In the last MONTH (on 0-10 scale) has pain interfered with the following?  1. General activity like being  able to carry out your everyday physical activities such as walking, climbing stairs, carrying groceries, or moving a chair?  Rating(4)   Left hip pain. At times, walking and certain movements make it painful

## 2022-11-12 ENCOUNTER — Encounter: Payer: Self-pay | Admitting: Physical Medicine and Rehabilitation

## 2022-12-02 ENCOUNTER — Ambulatory Visit: Payer: Self-pay

## 2022-12-02 ENCOUNTER — Ambulatory Visit (INDEPENDENT_AMBULATORY_CARE_PROVIDER_SITE_OTHER): Payer: BC Managed Care – PPO | Admitting: Physical Medicine and Rehabilitation

## 2022-12-02 DIAGNOSIS — M25552 Pain in left hip: Secondary | ICD-10-CM

## 2022-12-02 MED ORDER — BUPIVACAINE HCL 0.25 % IJ SOLN
4.0000 mL | INTRAMUSCULAR | Status: AC | PRN
  Administered 2022-12-02: 4 mL via INTRA_ARTICULAR

## 2022-12-02 MED ORDER — TRIAMCINOLONE ACETONIDE 40 MG/ML IJ SUSP
40.0000 mg | INTRAMUSCULAR | Status: AC | PRN
  Administered 2022-12-02: 40 mg via INTRA_ARTICULAR

## 2022-12-02 NOTE — Progress Notes (Signed)
Functional Pain Scale - descriptive words and definitions  Mild (2)   Noticeable when not distracted/no impact on ADL's/sleep only slightly affected and able to   use both passive and active distraction for comfort. Mild range order  Average Pain  varies   +Driver, -BT, -Dye Allergies.  Left hip pain. Pain comes and goes, but can vary by movements

## 2022-12-02 NOTE — Progress Notes (Signed)
Bruce [name] - 62 y.o. male MRN [id number]  Date of birth: 09-Nov-1961  Office Visit Note: Visit Date: 12/02/2022 PCP: Lawerance Cruel, MD Referred by: Lawerance Cruel, MD  Subjective: Chief Complaint  Patient presents with   Left Hip - Pain   HPI:  Bruce [name] is a 62 y.o. male who comes in today at the request of Barnet Pall, FNP for planned Left anesthetic hip arthrogram with fluoroscopic guidance.  The patient has failed conservative care including home exercise, medications, time and activity modification.  This injection will be diagnostic and hopefully therapeutic.  Please see requesting physician notes for further details and justification.   ROS Otherwise per HPI.  Assessment & Plan: Visit Diagnoses:    ICD-10-CM   1. Pain in left hip  M25.552 XR C-ARM NO REPORT    Large Joint Inj: L hip joint      Plan: No additional findings.   Meds & Orders: No orders of the defined types were placed in this encounter.   Orders Placed This Encounter  Procedures   Large Joint Inj: L hip joint   XR C-ARM NO REPORT    Follow-up: Return for visit to requesting provider as needed.   Procedures: Large Joint Inj: L hip joint on 12/02/2022 9:11 AM Indications: diagnostic evaluation and pain Details: 22 G 3.5 in needle, fluoroscopy-guided anterior approach  Arthrogram: No  Medications: 4 mL bupivacaine 0.25 %; 40 mg triamcinolone acetonide 40 MG/ML Outcome: tolerated well, no immediate complications  There was excellent flow of contrast producing a partial arthrogram of the hip. The patient did have relief of symptoms during the anesthetic phase of the injection. Procedure, treatment alternatives, risks and benefits explained, specific risks discussed. Consent was given by the patient. Immediately prior to procedure a time out was called to verify the correct patient, procedure, equipment, support staff and site/side marked as required. Patient was prepped and draped in the  usual sterile fashion.          Clinical History: EXAM: MRI LUMBAR SPINE WITHOUT CONTRAST   TECHNIQUE: Multiplanar, multisequence MR imaging of the lumbar spine was performed. No intravenous contrast was administered.   COMPARISON:  Prior radiograph from 05/14/2020.   FINDINGS: Segmentation: Standard. Lowest well-formed disc space labeled the L5-S1 level.   Alignment: Mild dextroscoliosis. Trace 2 mm retrolisthesis of L5 on S1. Additional trace retrolisthesis of L1 on L2 and L2 on L3.   Vertebrae: Vertebral body height maintained without acute or chronic fracture. Bone marrow signal intensity somewhat heterogeneous but within normal limits. Few scattered benign hemangiomata noted. No worrisome osseous lesions. Discogenic reactive endplate changes present about the L5-S1 interspace. No abnormal marrow edema.   Conus medullaris and cauda equina: Conus extends to the T12-L1 level. Conus and cauda equina appear normal.   Paraspinal and other soft tissues: Paraspinous soft tissues within normal limits. Visualized visceral structures are normal.   Disc levels:   T11-12: Disc bulge with disc desiccation. No significant spinal stenosis. Foramina remain patent.   T12-L1: Disc desiccation. Right foraminal to extraforaminal disc protrusion with reactive endplate spurring (series 13, image 4). This closely approximates the exiting nerve root without frank neural impingement. No significant spinal stenosis. Mild right foraminal narrowing.   L1-2: Mild disc bulge with disc desiccation. Shallow right foraminal to extraforaminal disc protrusion closely approximates the exiting right L1 nerve root without impingement or displacement (series 13, image 11). No spinal stenosis. Foramina remain patent.   L2-3: Disc desiccation with  mild disc bulge. Mild facet hypertrophy. No canal or foraminal stenosis.   L3-4: Disc desiccation with mild disc bulge. Superimposed small left foraminal  disc protrusion (series 10, image 25). This closely approximates the exiting left L3 nerve root without impingement or displacement. Mild facet hypertrophy. No significant spinal stenosis. Foramina remain patent.   L4-5: Disc desiccation with mild disc bulge. Mild reactive endplate change. Superimposed small biforaminal to extraforaminal disc protrusions (series 10, image 32). Associated annular fissures. Moderate bilateral facet hypertrophy. No significant spinal stenosis. Foramina remain patent.   L5-S1: Degenerative intervertebral disc space narrowing with disc desiccation and diffuse disc bulge. Reactive endplate spurring. Superimposed right subarticular disc extrusion with inferior migration, extending into the right lateral recess and impinging upon the descending right S1 nerve root (series 13, image 38). Mild to moderate bilateral facet hypertrophy. Resultant moderate to severe right lateral recess stenosis. Central canal remains patent. No significant foraminal encroachment.   IMPRESSION: 1. Right subarticular disc extrusion with inferior migration at L5-S1, impinging upon the descending right S1 nerve root. 2. Small biforaminal to extraforaminal disc protrusions at L4-5 without neural impingement. 3. Right foraminal to extraforaminal disc protrusions at T12-L1 and L1-2, closely approximating and potentially irritating the exiting right T12 and L1 nerve roots respectively. 4. Small left foraminal disc protrusion at L3-4, potentially affecting the exiting left L3 nerve root.     Electronically Signed   By: Jeannine Boga M.D.   On: 10/01/2020 04:54     Objective:  VS:  HT:    WT:   BMI:     BP:   HR: bpm  TEMP: ( )  RESP:  Physical Exam   Imaging: XR C-ARM NO REPORT  Result Date: 12/02/2022 Please see Notes tab for imaging impression.

## 2022-12-13 ENCOUNTER — Other Ambulatory Visit: Payer: Self-pay | Admitting: Cardiovascular Disease

## 2023-01-08 ENCOUNTER — Other Ambulatory Visit: Payer: Self-pay | Admitting: Physical Medicine and Rehabilitation

## 2023-02-05 ENCOUNTER — Other Ambulatory Visit: Payer: Self-pay | Admitting: Physical Medicine and Rehabilitation

## 2023-03-01 ENCOUNTER — Other Ambulatory Visit: Payer: Self-pay | Admitting: Physical Medicine and Rehabilitation

## 2023-03-03 ENCOUNTER — Ambulatory Visit (INDEPENDENT_AMBULATORY_CARE_PROVIDER_SITE_OTHER): Payer: BLUE CROSS/BLUE SHIELD | Admitting: Podiatry

## 2023-03-03 ENCOUNTER — Ambulatory Visit (INDEPENDENT_AMBULATORY_CARE_PROVIDER_SITE_OTHER): Payer: BLUE CROSS/BLUE SHIELD

## 2023-03-03 DIAGNOSIS — M205X1 Other deformities of toe(s) (acquired), right foot: Secondary | ICD-10-CM

## 2023-03-03 DIAGNOSIS — M109 Gout, unspecified: Secondary | ICD-10-CM

## 2023-03-03 DIAGNOSIS — M84374A Stress fracture, right foot, initial encounter for fracture: Secondary | ICD-10-CM

## 2023-03-03 NOTE — Progress Notes (Signed)
  Subjective:  Patient ID: Bruce [name], male    DOB: 02-08-1961,  MRN: [id number]  Chief Complaint  Patient presents with   Foot Pain    (np) right foot pain-near the top of the foot - started 9 days ago, getting better the last 2 days - no injury or trauma -     62 y.o. male presents with the above complaint. History confirmed with patient.  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   Radiographs: Multiple views x-ray of the right foot: Nondisplaced fracture of second metatarsal metaphysis Assessment:   1. Stress fracture of metatarsal bone of right foot, initial encounter   2. Hallux limitus, right      Plan:  Patient was evaluated and treated and all questions answered.  Reviewed his radiographs.  Discussed that he has a developed stress fracture of the second metatarsal.  I recommended checking his vitamin D and calcium level he says previously this has been low.  We will supplement as needed.  Discussed nonoperative treatment of this, I recommended weightbearing as tolerated with immobilization in a cam walker boot.  This was dispensed to support fracture healing.  I will see him back in 1 month with new radiographs.  Return in about 1 month (around 04/02/2023) for Follow-up right foot stress fracture with new x-rays.

## 2023-04-02 ENCOUNTER — Encounter: Payer: Self-pay | Admitting: Podiatry

## 2023-04-02 ENCOUNTER — Ambulatory Visit (INDEPENDENT_AMBULATORY_CARE_PROVIDER_SITE_OTHER): Payer: BC Managed Care – PPO | Admitting: Podiatry

## 2023-04-02 ENCOUNTER — Ambulatory Visit (INDEPENDENT_AMBULATORY_CARE_PROVIDER_SITE_OTHER): Payer: BC Managed Care – PPO

## 2023-04-02 DIAGNOSIS — M84374A Stress fracture, right foot, initial encounter for fracture: Secondary | ICD-10-CM | POA: Diagnosis not present

## 2023-04-02 NOTE — Progress Notes (Signed)
  Subjective:  Patient ID: Bruce [name], male    DOB: Sep 11, 1961,  MRN: [id number]  Chief Complaint  Patient presents with   Fracture    4 week Follow-up right foot stress fracture with new x-rays.    62 y.o. male presents with the above complaint. History confirmed with patient.  He reports improvement in pain and swelling.  He has been wearing the boot and walking and it  Objective:  Physical Exam: warm, good capillary refill, no trophic changes or ulcerative lesions, normal DP and PT pulses, normal sensory exam, and today palpable fullness and bony prominence with bone callus around the midshaft of the second metatarsal, no tenderness to palpation   Radiographs: Multiple views x-ray of the right foot: Good callus formation around second metatarsal stress fracture Assessment:   1. Stress fracture of metatarsal bone of right foot, initial encounter      Plan:  Patient was evaluated and treated and all questions answered.  New radiographs taken today and reviewed with patient.  We discussed that he has appropriate bone healing with callus formation.  I recommended another 2 to 3 weeks utilizing the cam boot for ambulation and then can begin to transition back to a stiff supportive shoe.  Return in 5 weeks for final follow-up radiographs.  Sooner if there are issues.  Return in about 5 weeks (around 05/07/2023) for stress fracture follow up (new xrays).

## 2023-05-07 ENCOUNTER — Ambulatory Visit (INDEPENDENT_AMBULATORY_CARE_PROVIDER_SITE_OTHER): Payer: BC Managed Care – PPO

## 2023-05-07 ENCOUNTER — Ambulatory Visit (INDEPENDENT_AMBULATORY_CARE_PROVIDER_SITE_OTHER): Payer: BC Managed Care – PPO | Admitting: Podiatry

## 2023-05-07 DIAGNOSIS — M84374A Stress fracture, right foot, initial encounter for fracture: Secondary | ICD-10-CM

## 2023-05-07 NOTE — Progress Notes (Signed)
  Subjective:  Patient ID: Bruce [name], male    DOB: 30-Oct-1961,  MRN: [id number]  Chief Complaint  Patient presents with   Fracture    5 week follow up stress fracture right    62 y.o. male presents with the above complaint. History confirmed with patient.  He is doing well he has been ambulating in regular shoes.  No pain.  Objective:  Physical Exam: warm, good capillary refill, no trophic changes or ulcerative lesions, normal DP and PT pulses, normal sensory exam, and today palpable fullness and bony prominence with bone callus around the midshaft of the second metatarsal, no tenderness to palpation   Radiographs: Multiple views x-ray of the right foot: Continued healing of his fracture with good stable callus formation Assessment:   1. Stress fracture of metatarsal bone of right foot, initial encounter      Plan:  Patient was evaluated and treated and all questions answered.  Doing much better.  Can continue low intensity and nonimpact exercise gradually.  Discussed possibility of recurrence or refracture.  Return as needed if symptoms return or recur.  Return if symptoms worsen or fail to improve.

## 2023-06-03 DIAGNOSIS — E559 Vitamin D deficiency, unspecified: Secondary | ICD-10-CM | POA: Diagnosis not present

## 2023-06-03 DIAGNOSIS — Z0000 Encounter for general adult medical examination without abnormal findings: Secondary | ICD-10-CM | POA: Diagnosis not present

## 2023-06-03 DIAGNOSIS — Z13220 Encounter for screening for lipoid disorders: Secondary | ICD-10-CM | POA: Diagnosis not present

## 2023-06-03 DIAGNOSIS — Z125 Encounter for screening for malignant neoplasm of prostate: Secondary | ICD-10-CM | POA: Diagnosis not present

## 2023-06-03 DIAGNOSIS — R7303 Prediabetes: Secondary | ICD-10-CM | POA: Diagnosis not present

## 2023-06-11 DIAGNOSIS — R7303 Prediabetes: Secondary | ICD-10-CM | POA: Diagnosis not present

## 2023-06-11 DIAGNOSIS — R21 Rash and other nonspecific skin eruption: Secondary | ICD-10-CM | POA: Diagnosis not present

## 2023-06-11 DIAGNOSIS — Z0000 Encounter for general adult medical examination without abnormal findings: Secondary | ICD-10-CM | POA: Diagnosis not present

## 2023-08-24 ENCOUNTER — Ambulatory Visit (INDEPENDENT_AMBULATORY_CARE_PROVIDER_SITE_OTHER): Payer: BC Managed Care – PPO | Admitting: Physical Medicine and Rehabilitation

## 2023-08-24 ENCOUNTER — Other Ambulatory Visit: Payer: Self-pay

## 2023-08-24 DIAGNOSIS — M25552 Pain in left hip: Secondary | ICD-10-CM | POA: Diagnosis not present

## 2023-08-24 MED ORDER — BUPIVACAINE HCL 0.25 % IJ SOLN
4.0000 mL | INTRAMUSCULAR | Status: AC | PRN
Start: 2023-08-24 — End: 2023-08-24
  Administered 2023-08-24: 4 mL via INTRA_ARTICULAR

## 2023-08-24 MED ORDER — TRIAMCINOLONE ACETONIDE 40 MG/ML IJ SUSP
40.0000 mg | INTRAMUSCULAR | Status: AC | PRN
Start: 2023-08-24 — End: 2023-08-24
  Administered 2023-08-24: 40 mg via INTRA_ARTICULAR

## 2023-08-24 NOTE — Addendum Note (Signed)
Addended by: Ashok Norris on: 08/24/2023 08:41 AM   Modules accepted: Orders

## 2023-08-24 NOTE — Progress Notes (Signed)
Bruce [name] - 62 y.o. male MRN [id number]  Date of birth: 01/23/1961  Office Visit Note: Visit Date: 08/24/2023 PCP: Daisy Floro, MD Referred by: Daisy Floro, MD  Subjective: Chief Complaint  Patient presents with   Left Hip - Pain   HPI:  Bruce [name] is a 62 y.o. male who comes in today at the request of Dr. Doneen Poisson for planned Left anesthetic hip arthrogram with fluoroscopic guidance.  The patient has failed conservative care including home exercise, medications, time and activity modification.  This injection will be diagnostic and hopefully therapeutic.  Please see requesting physician notes for further details and justification.    ROS Otherwise per HPI.  Assessment & Plan: Visit Diagnoses:    ICD-10-CM   1. Pain in left hip  M25.552 Large Joint Inj: L hip joint      Plan: No additional findings.   Meds & Orders: No orders of the defined types were placed in this encounter.   Orders Placed This Encounter  Procedures   Large Joint Inj: L hip joint    Follow-up: Return if symptoms worsen or fail to improve.   Procedures: Large Joint Inj: L hip joint on 08/24/2023 8:33 AM Indications: diagnostic evaluation and pain Details: 22 G 3.5 in needle, fluoroscopy-guided anterior approach  Arthrogram: No  Medications: 4 mL bupivacaine 0.25 %; 40 mg triamcinolone acetonide 40 MG/ML Outcome: tolerated well, no immediate complications  There was excellent flow of contrast producing a partial arthrogram of the hip. The patient did have relief of symptoms during the anesthetic phase of the injection. Procedure, treatment alternatives, risks and benefits explained, specific risks discussed. Consent was given by the patient. Immediately prior to procedure a time out was called to verify the correct patient, procedure, equipment, support staff and site/side marked as required. Patient was prepped and draped in the usual sterile fashion.           Clinical History: EXAM: MRI LUMBAR SPINE WITHOUT CONTRAST   TECHNIQUE: Multiplanar, multisequence MR imaging of the lumbar spine was performed. No intravenous contrast was administered.   COMPARISON:  Prior radiograph from 05/14/2020.   FINDINGS: Segmentation: Standard. Lowest well-formed disc space labeled the L5-S1 level.   Alignment: Mild dextroscoliosis. Trace 2 mm retrolisthesis of L5 on S1. Additional trace retrolisthesis of L1 on L2 and L2 on L3.   Vertebrae: Vertebral body height maintained without acute or chronic fracture. Bone marrow signal intensity somewhat heterogeneous but within normal limits. Few scattered benign hemangiomata noted. No worrisome osseous lesions. Discogenic reactive endplate changes present about the L5-S1 interspace. No abnormal marrow edema.   Conus medullaris and cauda equina: Conus extends to the T12-L1 level. Conus and cauda equina appear normal.   Paraspinal and other soft tissues: Paraspinous soft tissues within normal limits. Visualized visceral structures are normal.   Disc levels:   T11-12: Disc bulge with disc desiccation. No significant spinal stenosis. Foramina remain patent.   T12-L1: Disc desiccation. Right foraminal to extraforaminal disc protrusion with reactive endplate spurring (series 13, image 4). This closely approximates the exiting nerve root without frank neural impingement. No significant spinal stenosis. Mild right foraminal narrowing.   L1-2: Mild disc bulge with disc desiccation. Shallow right foraminal to extraforaminal disc protrusion closely approximates the exiting right L1 nerve root without impingement or displacement (series 13, image 11). No spinal stenosis. Foramina remain patent.   L2-3: Disc desiccation with mild disc bulge. Mild facet hypertrophy. No canal or foraminal stenosis.  L3-4: Disc desiccation with mild disc bulge. Superimposed small left foraminal disc protrusion (series 10, image  25). This closely approximates the exiting left L3 nerve root without impingement or displacement. Mild facet hypertrophy. No significant spinal stenosis. Foramina remain patent.   L4-5: Disc desiccation with mild disc bulge. Mild reactive endplate change. Superimposed small biforaminal to extraforaminal disc protrusions (series 10, image 32). Associated annular fissures. Moderate bilateral facet hypertrophy. No significant spinal stenosis. Foramina remain patent.   L5-S1: Degenerative intervertebral disc space narrowing with disc desiccation and diffuse disc bulge. Reactive endplate spurring. Superimposed right subarticular disc extrusion with inferior migration, extending into the right lateral recess and impinging upon the descending right S1 nerve root (series 13, image 38). Mild to moderate bilateral facet hypertrophy. Resultant moderate to severe right lateral recess stenosis. Central canal remains patent. No significant foraminal encroachment.   IMPRESSION: 1. Right subarticular disc extrusion with inferior migration at L5-S1, impinging upon the descending right S1 nerve root. 2. Small biforaminal to extraforaminal disc protrusions at L4-5 without neural impingement. 3. Right foraminal to extraforaminal disc protrusions at T12-L1 and L1-2, closely approximating and potentially irritating the exiting right T12 and L1 nerve roots respectively. 4. Small left foraminal disc protrusion at L3-4, potentially affecting the exiting left L3 nerve root.     Electronically Signed   By: Rise Mu M.D.   On: 10/01/2020 04:54     Objective:  VS:  HT:    WT:   BMI:     BP:   HR: bpm  TEMP: ( )  RESP:  Physical Exam   Imaging: No results found.

## 2023-08-24 NOTE — Progress Notes (Signed)
Functional Pain Scale - descriptive words and definitions  Uncomfortable (3)  Pain is present but can complete all ADL's/sleep is slightly affected and passive distraction only gives marginal relief. Mild range order  Average Pain  varies from 4-6   +Driver, -BT, -Dye Allergies.  Left hip pain

## 2023-08-31 ENCOUNTER — Encounter: Payer: Self-pay | Admitting: Cardiovascular Disease

## 2023-08-31 ENCOUNTER — Ambulatory Visit: Payer: BC Managed Care – PPO | Attending: Cardiovascular Disease | Admitting: Cardiovascular Disease

## 2023-08-31 VITALS — BP 128/86 | HR 68 | Ht 77.0 in | Wt 237.2 lb

## 2023-08-31 DIAGNOSIS — G4733 Obstructive sleep apnea (adult) (pediatric): Secondary | ICD-10-CM

## 2023-08-31 DIAGNOSIS — E785 Hyperlipidemia, unspecified: Secondary | ICD-10-CM | POA: Diagnosis not present

## 2023-08-31 DIAGNOSIS — Z8249 Family history of ischemic heart disease and other diseases of the circulatory system: Secondary | ICD-10-CM

## 2023-08-31 DIAGNOSIS — I10 Essential (primary) hypertension: Secondary | ICD-10-CM

## 2023-08-31 DIAGNOSIS — R931 Abnormal findings on diagnostic imaging of heart and coronary circulation: Secondary | ICD-10-CM

## 2023-08-31 NOTE — Patient Instructions (Signed)
Medication Instructions:  No medication changes *If you need a refill on your cardiac medications before your next appointment, please call your pharmacy*   Lab Work: No labs ordered If you have labs (blood work) drawn today and your tests are completely normal, you will receive your results only by: MyChart Message (if you have MyChart) OR A paper copy in the mail If you have any lab test that is abnormal or we need to change your treatment, we will call you to review the results.   Testing/Procedures: none  Your next appointment:    AS NEEDED FOR SLEEP ISSUES  Provider:   Nicki Guadalajara, MD

## 2023-08-31 NOTE — Progress Notes (Unsigned)
Patient ID: Bruce [name], male   DOB: 1961-08-11, 62 y.o.   MRN: [id number]   Primary M.D.: Dr. Thomasene Mohair  HPI: [name] is a 45 white male presents to the office for a 6 month follow-up cardiology evaluation.  [name] has a strong family history for coronary artery disease and denies any known CAD or episodes of chest pain. He states that over the years he has had slight progression of  cholesterol elevation but has consistently had low good cholesterol levels. He is followed by Dr. Judithann Sauger.  Laboratory August 2013 showed a total cholesterol 178, triglycerides 196, HDL cholesterol 33, non-HDL cholesterol 145 and LDL cholesterol 113. He had never been on lipid lowering therapy and at that time did not take any medications with the exception of aspirin and over-the-counter fish oil.    In 2014, after his mother had suffered a heart attack, he underwent a routine treadmill test.  His was interpreted as negative, adequate Bruce treadmill test and he was able to exercise for 13.7 that workload and achieving AP, rate of 102% of age-predicted maximum heart rate.  He did not have diagnostic ST changes of ischemia.  He underwent an echo Doppler study on 05/17/2013 which revealed normal systolic function.  There was grade 1 diastolic dysfunction.  He had mild thickening of his mitral valve with systolic bowing without definitive prolapse.  Over the past several years, he has continued to be active.  He denies any exertional precipitation of chest pain.  He denies any change in exercise tolerance.  He was started on atorvastatin 20 mg and takes omega-3 fatty acids as well as coenzyme Q10 for hyperlipidemia.  Due to complaints of significant deterioration of his sleep quality with loud snoring and nonrestorative sleep.  I referred him for sleep study  which was done on 07/30/2015.  He was found to have mild obstructive sleep apnea with an AHI overall of 7.3 per hour.  However, he had a severe  positional component with severe sleep apnea with supine position with an AHI of 51.4 per hour.  There was mild oxygen desaturation to a nadir of 86%. There was loud snoring.  He underwent a CPAP titration trial on 08/21/2015 and ultimately his obstructive apnea was improved at a CPAP pressure of 15.  However, with his previous documentation of a marked positional component to his sleep apnea a CPAP auto unit was recommended.  CPAP was instituted on 10/04/2015.  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  When I saw him in January 2017 he was meeting Medicare compliance standards with 100% use.  He notes significantly more energy.  He denies any residual daytime sleepiness.  He is unaware of breakthrough snoring.  He denies any nocturnal palpitations.  I saw him in November 2018 at which time he remained stable and he denied any episodes of chest pain shortness of breath or palpitations.  He was continuing to use CPAP with 100% compliance and is continued to note significant benefit.    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  He has continued to be on losartan 50 mg daily for hypertension.  He continues to be on Zetia 10 mg and omega-3 fatty acid.  Laboratory on  10/15/2017  showed total cholesterol 171, HDL 36, LDL 90, triglycerides 225.  He states that he has some dark chocolate every  day.  He has a history of GERD and intermittently has used omeprazole.    I saw him in December 2019 at which time he remained stable.  He denies any chest pain PND orthopnea.  Earlier this year he had issues with left hip pain and was diagnosed as having trochanteric bursitis.   [facility] is his DME company and he uses CPAP with 100% compliance.  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   He was evaluated   in a telemedicine visit in June 2021.  At a prior evaluation, I recommended switching losartan to olmesartan 20 mg.  His blood pressure had improved but blood pressure typically was running in the 130s to 140s.  At that telemedicine visit I recommended further titration of olmesartan to 40 mg daily.  He continues to be on Zetia and omega-3 fatty acids for hyperlipidemia.  In December 2019 and LDL cholesterol was 88.    I saw him on November 15, 2020.  Since his prior evaluation he had  issues with development of a kidney stone.  This was unsuccessfully treated with lithotripsy and he ultimately required cystoscopy with right uteroscopy and laser lithotripsy with right JJ stent placement and a 1.3 cm right distal ureteral calculus was removed.  Since that time he has significantly changed his diet.  He gave up sodas which contain significant sugar.  He lost 15 pounds.  He also required injection into his left hip.  And also he developed discomfort at a previous L5-S1 mini discectomy for which she required injection.  Presently he denies chest pain or shortness of breath.  He has a strong family history for premature coronary artery disease in family members with a father having had an MI at age 72 and subsequently had CABG surgery x3 as well as stents and a defibrillator.  He has continued to use CPAP therapy with 100% compliance and has an old CPAP machine which is still functional.  With a strong family history of CAD we discussed obtaining a coronary calcium score for additional risk stratification.  I saw him on January 01, 2022.  Over the prior year he had remained stable.  [name]  He underwent a coronary calcium score on November 22, 2020 which was 57.7 agatston units with mild calcification in the LAD.  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  His blood pressure has been stable and he continues to be on olmesartan 40 mg.  He is on rosuvastatin 10 mg and Zetia 10 mg for hyperlipidemia.  He takes  meloxicam on a as needed basis with hip discomfort intermittently and takes a baby aspirin.  He denies any chest pain or shortness of breath.  He continues to use CPAP with 100% compliance.  He has an old S9 AutoSet unit with set up date from November 2016.  A download was obtained from January through January 01, 2022 which confirmed 100% compliance.  His pressure ranges at 8 to 20 cm.  AHI is 6.0.  His 95th percentile pressure 16.1 with maximum average pressure of 18.0.  That evaluation, he qualified for a new machine.  I change his pressure settings to a range of 11 to 20 cm with his 95th percentile pressure at 16.1.  I ordered him a new ResMed AirSense 11 CPAP auto unit.  I last saw him on July 23, 2012.  Mr. Raup received a new ResMed AirSense 11 AutoSet unit on March 27, 2022.  I was able to obtain a download from April 27 through July 22, 2022.  Compliance is excellent with 100% use and average use at 7 hours and 32 minutes.  At his pressure setting of 11 to 20 cm, AHI is 4.6/h.  His 95th percentile pressure is 15.6 and maximum average pressure 17.3 cm of water.   I calculated an Epworth Sleepiness Scale score in the office today and this endorsed at 4 arguing against residual daytime sleepiness.  He continues to be on olmesartan 40 mg for hypertension.  He is on Zetia 10 mg and rosuvastatin 10 mg for hyperlipidemia in addition to over-the-counter fish oil.  He is on baby aspirin.  He presents for reevaluation.  Past Medical History:  Diagnosis Date   Arthritis    not diagnosed. hip   GERD (gastroesophageal reflux disease)    History of kidney stones    Hypertension    Sleep apnea    wears cpap. 4.5 is one setting    Past Surgical History:  Procedure Laterality Date   CYSTOSCOPY/URETEROSCOPY/HOLMIUM LASER/STENT PLACEMENT Right 05/25/2020   Procedure: CYSTOSCOPY/RETROGRADE/URETEROSCOPY/HOLMIUM LASER/STENT PLACEMENT;  Surgeon: Rene Paci, MD;  Location: [facility];  Service: Urology;  Laterality: Right;   EXTRACORPOREAL SHOCK WAVE LITHOTRIPSY Right 03/05/2020   Procedure: EXTRACORPOREAL SHOCK WAVE LITHOTRIPSY (ESWL);  Surgeon: Rene Paci, MD;  Location: [facility];  Service: Urology;  Laterality: Right;   ruptured disc  2002   Dr. Tammy Sours YATES L5 MICRODIS   TONSILLECTOMY     as child    No Known Allergies  Current Outpatient Medications  Medication Sig Dispense Refill   aspirin EC 81 MG tablet Take 81 mg by mouth daily. Swallow whole.     ezetimibe (ZETIA) 10 MG tablet TAKE 1 TABLET BY MOUTH EVERY DAY **MUST KEEP APPT FOR REFILLS** 90 tablet 3   fish oil-omega-3 fatty acids 1000 MG capsule Take by mouth daily. 1200 MG CAPS. Takes 2 daily.     meloxicam (MOBIC) 15 MG tablet TAKE 1 TABLET BY MOUTH EVERY DAY WITH FOOD 30 tablet 0   olmesartan (BENICAR) 40 MG tablet TAKE 1 TABLET (40 MG TOTAL) BY MOUTH DAILY. NEED OFFICE VISIT 90 tablet 3   rosuvastatin (CRESTOR) 10 MG tablet TAKE 1 TABLET BY MOUTH EVERY DAY 90 tablet 3   No current facility-administered medications for this visit.    Socially, he completed 12th grade education. He is married for 35 years the he works for [employer]. 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 He exercises approximately 2 times per week doing he routinely does not do cardiac or aerobic workouts. He never smoked tobacco. He denies alcohol use.  He continues to be active raising and showing [place] pigeons.  Family History  Problem Relation Age of Onset   Heart attack Mother    Hyperlipidemia Mother    Hypertension Mother    Heart attack Father    Hyperlipidemia Father    Colon cancer Maternal Grandmother    Heart attack Paternal Grandmother    Heart disease Paternal Grandmother    Heart attack Paternal Grandfather    Heart disease Paternal Grandfather     ROS General: Negative; No fevers, chills, or night sweats;  HEENT: Negative; No changes in vision or  hearing, sinus congestion, difficulty swallowing Pulmonary: Negative; No cough, wheezing, shortness of breath, hemoptysis Cardiovascular: Negative;  No chest pain, presyncope, syncope, palpitations GI: Negative; No nausea, vomiting, diarrhea, or abdominal pain GU: Recent kidney stone Musculoskeletal: Recent left hip injection Hematologic/Oncology: Negative; no easy bruising, bleeding Endocrine: Negative; no heat/cold intolerance; no diabetes Neuro: Recent L5-S1 injection Skin: Negative; No rashes or skin lesions Psychiatric: Negative; No behavioral problems, depression Sleep: Positive for obstructive sleep apnea, on CPAP therapy with resolution of prior nonrestorative sleep, snoring, and daytime sleepiness;  no bruxism, restless legs, hypnogognic hallucinations, no cataplexy Other comprehensive 14 point system review is negative.   PE BP 128/86   [name] 66   Ht 6\' 5"  (1.956 m)   Wt 237 lb 3.2 oz (107.6 kg)   SpO2 98%   BMI 28.13 kg/m    Repeat blood pressure by me 126/80  Wt Readings from Last 3 Encounters:  08/31/23 237 lb 3.2 oz (107.6 kg)  07/23/22 232 lb (105.2 kg)  01/01/22 237 lb 9.6 oz (107.8 kg)   General: Alert, oriented, no distress.  Skin: normal turgor, no rashes, warm and dry HEENT: Normocephalic, atraumatic. Pupils equal round and reactive to light; sclera anicteric; extraocular muscles intact;  Nose without nasal septal hypertrophy Mouth/Parynx benign; Mallinpatti scale 3 Neck: No JVD, no carotid bruits; normal carotid upstroke Lungs: clear to ausculatation and percussion; no wheezing or rales Chest wall: without tenderness to palpitation Heart: PMI not displaced, RRR, s1 s2 normal, 1/6 systolic murmur, no diastolic murmur, no rubs, gallops, thrills, or heaves Abdomen: soft, nontender; no hepatosplenomehaly, BS+; abdominal aorta nontender and not dilated by palpation. Back: no CVA tenderness Pulses 2+ Musculoskeletal: full range of motion, normal strength, no  joint deformities Extremities: no clubbing cyanosis or edema, Homan's sign negative  Neurologic: grossly nonfocal; Cranial nerves grossly wnl Psychologic: Normal mood and affect  EKG Interpretation Date/Time:  Monday August 31 2023 08:20:38 EDT Ventricular Rate:  68 PR Interval:  176 QRS Duration:  126 QT Interval:  402 QTC Calculation: 427 R Axis:   31  Text Interpretation: Normal sinus rhythm Right bundle branch block When compared with ECG of 25-May-2020 13:03, Right bundle branch block is now Present Confirmed by Nicki Guadalajara ([id number]) on 08/31/2023 8:41:04 AM    July 23, 2022 ECG (independently read by me):  NSR at 74, no ectopy,normalintervals   January 01, 2022 ECG (independently read by me):  NSR at 71, no ectopy, normal intervals   November 15, 2020 ECG (independently read by me): NSR at 84, no significant ST changes; no ectopy  December 2019 ECG (independently read by me): Normal sinus rhythm at 71 bpm.  No ectopy.  Normal intervals.  November 2018 ECG (independently read by me): normal sinus rhythm at 62 bpm  November 2017 ECG (independently read by me): Normal sinus rhythm at 70 bpm.  September 2016 ECG (independently read by me): Sinus rhythm at 66 bpm.  No ectopy.  Normal intervals.  June 2016 ECG (independently read by me):  Normal sinus rhythm at 68 bpm.  Normal intervals.  Prior ECG: Sinus bradycardia at 59 beats per minute. PR interval 178 ms, QTc interval 403 ms.  LABS: Blood work from [employer] [facility] on 09/18/2016 was reviewed.   Hemoglobin 16.8, hematocrit 47.6.  Fasting glucose 120.  BUN 16, Cr1.09.  LFTs normal with an  an AST of 26 and ALT of 31.  Lipid studies revealed total cholesterol 180, triglycerides 194, HDL 32, LDL 109.  Most recent blood work from 10/15/2017 was reviewed.  Total cholesterol 171, HDL 36, LDL 90, triglycerides 225.  Hemoglobin was a 5.6.  Hemoglobin 15.4.  Creatinine 0.96.  TSH 2.5.  ALT 37.  Laboratory from Apr 25, 2020 by Dr. Gildardo Cranker was reviewed.  Total cholesterol 136, LDL 83, HDL 33, triglycerides 109.  Hemoglobin A1c from December 2019 5.7.  Creatinine from May 25, 2020 was 1.0.  Recent laboratory work from May 14, 2022 was reviewed: Total cholesterol 103, triglycerides 97, LDL 52, HDL 33.  Creatinine 1.02.  TSH was 2.15 on January 01, 2022.       Latest Ref Rng & Units 01/01/2022   10:32 AM 05/25/2020    1:19 PM 05/20/2020    6:34 PM  BMP  Glucose 70 - 99 mg/dL 308  657  846   BUN 8 - 27 mg/dL 18  23  20    Creatinine 0.76 - 1.27 mg/dL 9.62  9.52  8.41   BUN/Creat Ratio 10 - 24 17     Sodium 134 - 144 mmol/L 141  141  140   Potassium 3.5 - 5.2 mmol/L 4.6  4.3  4.4   Chloride 96 - 106 mmol/L 100  103  104   CO2 20 - 29 mmol/L 27   27   Calcium 8.6 - 10.2 mg/dL 9.7   9.5       Latest Ref Rng & Units 01/01/2022   10:32 AM 03/03/2020    6:13 PM 11/11/2018   10:59 AM  Hepatic Function  Total Protein 6.0 - 8.5 g/dL 7.4  7.4  6.9   Albumin 3.8 - 4.8 g/dL 4.8  4.1    AST 0 - 40 IU/L 38  32  28   ALT 0 - 44 IU/L 48  52  46   Alk Phosphatase 44 - 121 IU/L 51  47    Total Bilirubin 0.0 - 1.2 mg/dL 0.6  1.0  0.5       Latest Ref Rng & Units 01/01/2022   10:32 AM 05/25/2020    1:19 PM 05/20/2020    6:34 PM  CBC  WBC 3.4 - 10.8 x10E3/uL 4.7   13.3   Hemoglobin 13.0 - 17.7 g/dL 32.4  40.1  02.7   Hematocrit 37.5 - 51.0 % 47.3  48.0  47.0   Platelets 150 - 450 x10E3/uL 180   211    Lab Results  Component Value Date   MCV 92 01/01/2022   MCV 93.1 05/20/2020   MCV 91.8 03/03/2020   Lab Results  Component Value Date   TSH 2.150 01/01/2022   Lab Results  Component Value Date   HGBA1C 6.2 (H) 01/01/2022     Lipid Panel     Component Value Date/Time   CHOL 95 (L) 01/01/2022 1032   CHOL 166 05/25/2013 0855   TRIG 85 01/01/2022 1032   TRIG 179 (H) 05/25/2013 0855   HDL 35 (L) 01/01/2022 1032   HDL 33 (L) 05/25/2013 0855   CHOLHDL 2.7 01/01/2022 1032   CHOLHDL 4.1 11/11/2018 1059    VLDL 33 (H) 04/28/2017 0908   LDLCALC 43 01/01/2022 1032   LDLCALC 88 11/11/2018 1059   LDLCALC 97 05/25/2013 0855   IMPRESSION:  1. Essential hypertension   2. OSA (obstructive sleep apnea)     ASSESSMENT AND PLAN:  Mr. Adrial Ty is a 62 year-old Caucasian male who has a strong family history for coronary artery disease with both parents having had heart attacks. His father suffered his initial heart attack at age 68 and subsequently had CABG surgery x3  and also has stents as well as a defibrillator.  In 2014, he had a negative adequate graded exercise treadmill test which was done after his mother had suffered a heart attack.  An echo Doppler study showed normal systolic function with mild diastolic relaxation abnormality and a mildly thickened mitral valve with systolic bowing without definitive prolapse.  In 2019 I switched him from losartan to olmesartan 20 mg and at his telemedicine evaluation in June 2020 with blood pressure still in the upper 130s to 140 range further titration to 40 mg was done.  He has noted improvement in blood pressure since being on the olmesartan 40 mg.  He has been on Zetia 10 mg daily.  Most recent LDL cholesterol in May 2021 was 83.  When I saw him in December 2021, with his strong family history for CAD I recommended he undergo coronary calcium score.  His score was 57.7 agatston units and there was mild calcification in the LAD.  Presently, today blood pressure is excellent and on repeat by me was 126/80 on olmesartan 40 mg.  Lipid studies from May 14, 2022 were excellent with total cholesterol 103 LDL cholesterol 52 triglycerides 97 although HDL remains low at 33 on Zetia 10 mg and rosuvastatin 10 mg and over-the-counter omega-3 fatty acid.  He is using CPAP with 100% compliance and received a new ResMed AirSense 11 CPAP auto unit on March 27, 2022 with [facility] as his DME company.  His current settings are 11 to 20 cm of water.  AHI is 4.6 and his  95th percentile pressure is 15.6 with maximum average pressure 17.3.  I will make slight adjustment to his settings and will change his pressure range to 13 to 20 cm of water.  Clinically he is doing well.  He is stable.  I will see him in 1 year for reevaluation or sooner as needed.   Lennette Bihari, MD, [facility] 08/31/2023 8:38 AM

## 2023-09-02 ENCOUNTER — Encounter: Payer: Self-pay | Admitting: Cardiovascular Disease

## 2023-09-03 NOTE — Addendum Note (Signed)
Addended by: Brunetta Genera on: 09/03/2023 10:27 AM   Modules accepted: Orders

## 2023-09-09 DIAGNOSIS — R972 Elevated prostate specific antigen [PSA]: Secondary | ICD-10-CM | POA: Diagnosis not present

## 2023-09-22 DIAGNOSIS — R350 Frequency of micturition: Secondary | ICD-10-CM | POA: Diagnosis not present

## 2023-09-22 DIAGNOSIS — R3915 Urgency of urination: Secondary | ICD-10-CM | POA: Diagnosis not present

## 2023-09-25 DIAGNOSIS — G4733 Obstructive sleep apnea (adult) (pediatric): Secondary | ICD-10-CM | POA: Diagnosis not present

## 2023-10-27 DIAGNOSIS — L820 Inflamed seborrheic keratosis: Secondary | ICD-10-CM | POA: Diagnosis not present

## 2023-10-27 DIAGNOSIS — X32XXXD Exposure to sunlight, subsequent encounter: Secondary | ICD-10-CM | POA: Diagnosis not present

## 2023-10-27 DIAGNOSIS — L570 Actinic keratosis: Secondary | ICD-10-CM | POA: Diagnosis not present

## 2023-10-29 ENCOUNTER — Other Ambulatory Visit: Payer: Self-pay | Admitting: Cardiovascular Disease

## 2023-12-02 ENCOUNTER — Other Ambulatory Visit: Payer: Self-pay | Admitting: Cardiovascular Disease

## 2024-02-09 ENCOUNTER — Other Ambulatory Visit: Payer: Self-pay | Admitting: Physical Medicine and Rehabilitation

## 2024-02-23 ENCOUNTER — Other Ambulatory Visit: Payer: Self-pay | Admitting: Physical Medicine and Rehabilitation

## 2024-02-23 MED ORDER — MELOXICAM 15 MG PO TABS
15.0000 mg | ORAL_TABLET | Freq: Every day | ORAL | 0 refills | Status: DC
Start: 2024-02-23 — End: 2024-03-28

## 2024-03-05 DIAGNOSIS — S61431A Puncture wound without foreign body of right hand, initial encounter: Secondary | ICD-10-CM | POA: Diagnosis not present

## 2024-03-05 DIAGNOSIS — Z23 Encounter for immunization: Secondary | ICD-10-CM | POA: Diagnosis not present

## 2024-03-16 DIAGNOSIS — Z4802 Encounter for removal of sutures: Secondary | ICD-10-CM | POA: Diagnosis not present

## 2024-03-27 ENCOUNTER — Other Ambulatory Visit: Payer: Self-pay | Admitting: Physical Medicine and Rehabilitation

## 2024-06-15 LAB — LAB REPORT - SCANNED
A1c: 6.5
EGFR: 96

## 2024-08-16 ENCOUNTER — Encounter: Payer: Self-pay | Admitting: Physical Medicine and Rehabilitation

## 2024-08-17 ENCOUNTER — Other Ambulatory Visit: Payer: Self-pay | Admitting: Physical Medicine and Rehabilitation

## 2024-08-17 DIAGNOSIS — M1612 Unilateral primary osteoarthritis, left hip: Secondary | ICD-10-CM

## 2024-08-17 DIAGNOSIS — M25552 Pain in left hip: Secondary | ICD-10-CM

## 2024-08-22 ENCOUNTER — Ambulatory Visit (INDEPENDENT_AMBULATORY_CARE_PROVIDER_SITE_OTHER): Admitting: Physical Medicine and Rehabilitation

## 2024-08-22 ENCOUNTER — Other Ambulatory Visit: Payer: Self-pay

## 2024-08-22 DIAGNOSIS — M25552 Pain in left hip: Secondary | ICD-10-CM | POA: Diagnosis not present

## 2024-08-22 MED ORDER — TRIAMCINOLONE ACETONIDE 40 MG/ML IJ SUSP
40.0000 mg | INTRAMUSCULAR | Status: AC | PRN
  Administered 2024-08-22: 40 mg via INTRA_ARTICULAR

## 2024-08-22 MED ORDER — BUPIVACAINE HCL 0.25 % IJ SOLN
4.0000 mL | INTRAMUSCULAR | Status: AC | PRN
  Administered 2024-08-22: 4 mL via INTRA_ARTICULAR

## 2024-08-22 NOTE — Progress Notes (Signed)
 Bruce [name] - 63 y.o. male MRN [id number]  Date of birth: 05-06-1961  Office Visit Note: Visit Date: 08/22/2024 PCP: Okey Carlin Redbird, MD Referred by: Okey Carlin Redbird, MD  Subjective: Chief Complaint  Patient presents with   Left Hip - Pain   HPI:  Bruce [name] is a 63 y.o. male who comes in today for planned repeat Left anesthetic hip arthrogram with fluoroscopic guidance.  The patient has failed conservative care including home exercise, medications, time and activity modification. Prior injection gave more than 50% relief for several months. This injection will be diagnostic and hopefully therapeutic.  Please see requesting physician notes for further details and justification.  Referring: Dr. Lonni Poli   ROS Otherwise per HPI.  Assessment & Plan: Visit Diagnoses:    ICD-10-CM   1. Pain in left hip  M25.552 Large Joint Inj: L hip joint    XR C-ARM NO REPORT      Plan: No additional findings.   Meds & Orders: No orders of the defined types were placed in this encounter.   Orders Placed This Encounter  Procedures   Large Joint Inj: L hip joint   XR C-ARM NO REPORT    Follow-up: Return if symptoms worsen or fail to improve.   Procedures: Large Joint Inj: L hip joint on 08/22/2024 8:34 AM Indications: diagnostic evaluation and pain Details: 22 G 3.5 in needle, fluoroscopy-guided anterior approach  Arthrogram: No  Medications: 4 mL bupivacaine  0.25 %; 40 mg triamcinolone  acetonide 40 MG/ML Outcome: tolerated well, no immediate complications  There was excellent flow of contrast producing a partial arthrogram of the hip. The patient did have relief of symptoms during the anesthetic phase of the injection. Procedure, treatment alternatives, risks and benefits explained, specific risks discussed. Consent was given by the patient. Immediately prior to procedure a time out was called to verify the correct patient, procedure, equipment, support staff and  site/side marked as required. Patient was prepped and draped in the usual sterile fashion.          Clinical History: EXAM: MRI LUMBAR SPINE WITHOUT CONTRAST   TECHNIQUE: Multiplanar, multisequence MR imaging of the lumbar spine was performed. No intravenous contrast was administered.   COMPARISON:  Prior radiograph from 05/14/2020.   FINDINGS: Segmentation: Standard. Lowest well-formed disc space labeled the L5-S1 level.   Alignment: Mild dextroscoliosis. Trace 2 mm retrolisthesis of L5 on S1. Additional trace retrolisthesis of L1 on L2 and L2 on L3.   Vertebrae: Vertebral body height maintained without acute or chronic fracture. Bone marrow signal intensity somewhat heterogeneous but within normal limits. Few scattered benign hemangiomata noted. No worrisome osseous lesions. Discogenic reactive endplate changes present about the L5-S1 interspace. No abnormal marrow edema.   Conus medullaris and cauda equina: Conus extends to the T12-L1 level. Conus and cauda equina appear normal.   Paraspinal and other soft tissues: Paraspinous soft tissues within normal limits. Visualized visceral structures are normal.   Disc levels:   T11-12: Disc bulge with disc desiccation. No significant spinal stenosis. Foramina remain patent.   T12-L1: Disc desiccation. Right foraminal to extraforaminal disc protrusion with reactive endplate spurring (series 13, image 4). This closely approximates the exiting nerve root without frank neural impingement. No significant spinal stenosis. Mild right foraminal narrowing.   L1-2: Mild disc bulge with disc desiccation. Shallow right foraminal to extraforaminal disc protrusion closely approximates the exiting right L1 nerve root without impingement or displacement (series 13, image 11). No spinal stenosis. Foramina  remain patent.   L2-3: Disc desiccation with mild disc bulge. Mild facet hypertrophy. No canal or foraminal stenosis.   L3-4: Disc  desiccation with mild disc bulge. Superimposed small left foraminal disc protrusion (series 10, image 25). This closely approximates the exiting left L3 nerve root without impingement or displacement. Mild facet hypertrophy. No significant spinal stenosis. Foramina remain patent.   L4-5: Disc desiccation with mild disc bulge. Mild reactive endplate change. Superimposed small biforaminal to extraforaminal disc protrusions (series 10, image 32). Associated annular fissures. Moderate bilateral facet hypertrophy. No significant spinal stenosis. Foramina remain patent.   L5-S1: Degenerative intervertebral disc space narrowing with disc desiccation and diffuse disc bulge. Reactive endplate spurring. Superimposed right subarticular disc extrusion with inferior migration, extending into the right lateral recess and impinging upon the descending right S1 nerve root (series 13, image 38). Mild to moderate bilateral facet hypertrophy. Resultant moderate to severe right lateral recess stenosis. Central canal remains patent. No significant foraminal encroachment.   IMPRESSION: 1. Right subarticular disc extrusion with inferior migration at L5-S1, impinging upon the descending right S1 nerve root. 2. Small biforaminal to extraforaminal disc protrusions at L4-5 without neural impingement. 3. Right foraminal to extraforaminal disc protrusions at T12-L1 and L1-2, closely approximating and potentially irritating the exiting right T12 and L1 nerve roots respectively. 4. Small left foraminal disc protrusion at L3-4, potentially affecting the exiting left L3 nerve root.     Electronically Signed   By: Morene Hoard M.D.   On: 10/01/2020 04:54     Objective:  VS:  HT:    WT:   BMI:     BP:   HR: bpm  TEMP: ( )  RESP:  Physical Exam   Imaging: No results found.

## 2024-08-22 NOTE — Progress Notes (Signed)
 Pain Scale   Average Pain 3 Patient advising he has chronic left hip pain that feels more aggravating than painfull        +Driver, -BT, -Dye Allergies.

## 2024-08-29 ENCOUNTER — Encounter: Payer: Self-pay | Admitting: Cardiology

## 2024-08-29 NOTE — Progress Notes (Unsigned)
 SLEEP MEDICINE VIRTUAL VISIT via Video Note   Because of Bruce [name]'s co-morbid illnesses, he is at least at moderate risk for complications without adequate follow up.  This format is felt to be most appropriate for this patient at this time.  All issues noted in this document were discussed and addressed.  A limited physical exam was performed with this format.  Please refer to the patient's chart for his consent to telehealth for [facility].   Date:  08/30/2024   ID:  Bruce [name], DOB June 05, 1961, MRN [id number] The patient was identified using 2 identifiers.  Patient Location: Home Provider Location: Home Office   PCP:  Okey Carlin Redbird, MD   [facility] Providers Cardiologist:  Debby Sor, MD (Inactive)     Evaluation Performed:  Follow-Up Visit  Chief Complaint:  OSA  History of Present Illness:    Bruce [name] is a 63 y.o. male with a hx of GERD, HTN and OSA on CPAP. He had been followeed by Dr. Sor in the past but is now transferred to my care upon his retirement. He complained in 2016 of loud snoring and poor sleep quality and underwent sleep study showing mild OSA with an [facility] of 7.3/hr but severe during REM sleep with REM AHI 51.4/hr.  O2% nadir was 86% with loud snoring.   He underwent CPAP titration to 15cm H2O and then was started on auto CPAP.    He receieved an new ResMed Airsense 11 device in 08/2022 and was set on auto from 11-20cm H2O. His current DME is [facility].  He is doing well with his PAP device and thinks that he has gotten used to it.  He tolerates the mask and feels the pressure is adequate.  Since going on PAP He feels rested in the am and has no significant daytime sleepiness.  He denies any significant mouth or nasal dryness or nasal congestion.  He does not think that he snores.Patient denies any episodes of bruxism, restless legs, no hypnogognic hallucinations or cataplectic events.    He has a known hx of coronary artery  calcifications with cor cal score of 57.7 mainly in the LAD.  He has a strong family hx of CAD.  He also has hx of HTN and HLD and is on statin therapy.  He denies any CP or pressure, SOB, DOE, LE edema, dizziness, palpitations or syncope.   Past Medical History:  Diagnosis Date   Agatston coronary artery calcium  score less than 100    cor cal score 57.7   Arthritis    not diagnosed. hip   GERD (gastroesophageal reflux disease)    History of kidney stones    Hypertension    Sleep apnea    wears cpap. 4.5 is one setting   Past Surgical History:  Procedure Laterality Date   CYSTOSCOPY/URETEROSCOPY/HOLMIUM LASER/STENT PLACEMENT Right 05/25/2020   Procedure: CYSTOSCOPY/RETROGRADE/URETEROSCOPY/HOLMIUM LASER/STENT PLACEMENT;  Surgeon: Devere Lonni Righter, MD;  Location: [facility];  Service: Urology;  Laterality: Right;   EXTRACORPOREAL SHOCK WAVE LITHOTRIPSY Right 03/05/2020   Procedure: EXTRACORPOREAL SHOCK WAVE LITHOTRIPSY (ESWL);  Surgeon: Devere Lonni Righter, MD;  Location: [facility];  Service: Urology;  Laterality: Right;   ruptured disc  2002   Dr. CATHLYN YATES L5 MICRODIS   TONSILLECTOMY     as child     No outpatient medications have been marked as taking for the 08/31/24 encounter (Video Visit) with Shlomo Corning  R, MD.     Allergies:   Patient has no known allergies.   Social History   Tobacco Use   Smoking status: Never   Smokeless tobacco: Never  Vaping Use   Vaping status: Never Used  Substance Use Topics   Alcohol use: Never    Alcohol/week: 0.0 standard drinks of alcohol    Comment: rarely has a drink   Drug use: Never     Family Hx: The patient's family history includes Colon cancer in his maternal grandmother; Heart attack in his father, mother, paternal grandfather, and paternal grandmother; Heart disease in his paternal grandfather and paternal grandmother; Hyperlipidemia in his father and mother; Hypertension in  his mother.  ROS:   Please see the history of present illness.     All other systems reviewed and are negative.   Prior Sleep studies:   The following studies were reviewed today:  PAP compliance download  Labs/Other Tests and Data Reviewed:     Recent Labs: No results found for requested labs within last 365 days.   Wt Readings from Last 3 Encounters:  08/31/23 237 lb 3.2 oz (107.6 kg)  07/23/22 232 lb (105.2 kg)  01/01/22 237 lb 9.6 oz (107.8 kg)     Risk Assessment/Calculations:      Objective:    Vital Signs:  There were no vitals taken for this visit.   VITAL SIGNS:  reviewed GEN:  no acute distress EYES:  sclerae anicteric, EOMI - Extraocular Movements Intact RESPIRATORY:  normal respiratory effort, symmetric expansion CARDIOVASCULAR:  no peripheral edema SKIN:  no rash, lesions or ulcers. MUSCULOSKELETAL:  no obvious deformities. NEURO:  alert and oriented x 3, no obvious focal deficit PSYCH:  normal affect  ASSESSMENT & PLAN:    OSA - The patient is tolerating PAP therapy well without any problems. The PAP download performed by his DME was personally reviewed and interpreted by me today and showed an AHI of 2.7/hr on auto CPAP from 14 to 20 cm H2O with 97% compliance in using more than 4 hours nightly.  The patient has been using and benefiting from PAP use and will continue to benefit from therapy.   HTN -BP controlled -continue Olmesartan  40mg  daily with PRN refills -check BMET  Coronary artery calcifications HLD -coronary Ca score was 57.7 in 2021 -LDL goal < 70 -denies any anginal sx -continue ASA 81mg  daily, Crestor  10mg  daily and Zetia  10mg  daily with PRN refills    Total time of encounter: 20 minutes total time of encounter, including 15 minutes spent in face-to-face patient care on the date of this encounter. This time includes coordination of care and counseling regarding above mentioned problem list. Remainder of non-face-to-face time  involved reviewing chart documents/testing relevant to the patient encounter and documentation in the medical record. I have independently reviewed documentation from referring provider.    Medication Adjustments/Labs and Tests Ordered: Current medicines are reviewed at length with the patient today.  Concerns regarding medicines are outlined above.   Tests Ordered: No orders of the defined types were placed in this encounter.   Medication Changes: No orders of the defined types were placed in this encounter.   Follow Up:  In Person in 1 year(s)  Signed, Wilbert Bihari, MD  08/30/2024 7:34 PM    [facility]

## 2024-08-30 ENCOUNTER — Telehealth (HOSPITAL_BASED_OUTPATIENT_CLINIC_OR_DEPARTMENT_OTHER): Payer: Self-pay | Admitting: *Deleted

## 2024-08-30 ENCOUNTER — Encounter: Payer: Self-pay | Admitting: Cardiology

## 2024-08-30 DIAGNOSIS — G4733 Obstructive sleep apnea (adult) (pediatric): Secondary | ICD-10-CM

## 2024-08-30 DIAGNOSIS — R931 Abnormal findings on diagnostic imaging of heart and coronary circulation: Secondary | ICD-10-CM | POA: Insufficient documentation

## 2024-08-30 NOTE — Telephone Encounter (Signed)
  Patient Consent for Virtual Visit        Bruce [name] has provided verbal consent on 08/30/2024 for a virtual visit (video or telephone).   CONSENT FOR VIRTUAL VISIT FOR:  Bruce [name]  By participating in this virtual visit I agree to the following:  I hereby voluntarily request, consent and authorize [facility] and its employed or contracted physicians, physician assistants, nurse practitioners or other licensed health care professionals (the Practitioner), to provide me with telemedicine health care services (the "Services) as deemed necessary by the treating Practitioner. I acknowledge and consent to receive the Services by the Practitioner via telemedicine. I understand that the telemedicine visit will involve communicating with the Practitioner through live audiovisual communication technology and the disclosure of certain medical information by electronic transmission. I acknowledge that I have been given the opportunity to request an in-person assessment or other available alternative prior to the telemedicine visit and am voluntarily participating in the telemedicine visit.  I understand that I have the right to withhold or withdraw my consent to the use of telemedicine in the course of my care at any time, without affecting my right to future care or treatment, and that the Practitioner or I may terminate the telemedicine visit at any time. I understand that I have the right to inspect all information obtained and/or recorded in the course of the telemedicine visit and may receive copies of available information for a reasonable fee.  I understand that some of the potential risks of receiving the Services via telemedicine include:  Delay or interruption in medical evaluation due to technological equipment failure or disruption; Information transmitted may not be sufficient (e.g. poor resolution of images) to allow for appropriate medical decision making by the Practitioner;  and/or  In rare instances, security protocols could fail, causing a breach of personal health information.  Furthermore, I acknowledge that it is my responsibility to provide information about my medical history, conditions and care that is complete and accurate to the best of my ability. I acknowledge that Practitioner's advice, recommendations, and/or decision may be based on factors not within their control, such as incomplete or inaccurate data provided by me or distortions of diagnostic images or specimens that may result from electronic transmissions. I understand that the practice of medicine is not an exact science and that Practitioner makes no warranties or guarantees regarding treatment outcomes. I acknowledge that a copy of this consent can be made available to me via my patient portal [facility] MyChart), or I can request a printed copy by calling the office of  [facility].    I understand that my insurance will be billed for this visit.   I have read or had this consent read to me. I understand the contents of this consent, which adequately explains the benefits and risks of the Services being provided via telemedicine.  I have been provided ample opportunity to ask questions regarding this consent and the Services and have had my questions answered to my satisfaction. I give my informed consent for the services to be provided through the use of telemedicine in my medical care

## 2024-08-31 ENCOUNTER — Encounter: Payer: Self-pay | Admitting: Cardiology

## 2024-08-31 ENCOUNTER — Ambulatory Visit: Attending: Internal Medicine | Admitting: Cardiology

## 2024-08-31 VITALS — Wt 235.0 lb

## 2024-08-31 DIAGNOSIS — R931 Abnormal findings on diagnostic imaging of heart and coronary circulation: Secondary | ICD-10-CM

## 2024-08-31 DIAGNOSIS — I10 Essential (primary) hypertension: Secondary | ICD-10-CM | POA: Diagnosis not present

## 2024-08-31 DIAGNOSIS — G4733 Obstructive sleep apnea (adult) (pediatric): Secondary | ICD-10-CM | POA: Diagnosis not present

## 2024-08-31 DIAGNOSIS — E785 Hyperlipidemia, unspecified: Secondary | ICD-10-CM

## 2024-08-31 NOTE — Progress Notes (Signed)
 This encounter was created in error - please disregard.

## 2024-08-31 NOTE — Patient Instructions (Signed)
 Medication Instructions:   Your physician recommends that you continue on your current medications as directed. Please refer to the Current Medication list given to you today.    Labwork:  We will obtain a copy of your recent labs from your Primary Care Provider for Dr. Shlomo to review.      Follow-Up: Please follow up in _ONE YEAR for sleep with Dr. Shlomo

## 2024-08-31 NOTE — Procedures (Signed)
 Erroneous encounter

## 2024-09-01 ENCOUNTER — Ambulatory Visit: Payer: Self-pay | Admitting: Cardiology

## 2024-10-03 ENCOUNTER — Encounter: Payer: Self-pay | Admitting: Radiology

## 2024-10-24 ENCOUNTER — Other Ambulatory Visit: Payer: Self-pay

## 2024-10-24 MED ORDER — OLMESARTAN MEDOXOMIL 40 MG PO TABS
40.0000 mg | ORAL_TABLET | Freq: Every day | ORAL | 3 refills | Status: AC
Start: 2024-10-24 — End: ?

## 2024-10-24 MED ORDER — ROSUVASTATIN CALCIUM 10 MG PO TABS
10.0000 mg | ORAL_TABLET | Freq: Every day | ORAL | 3 refills | Status: AC
Start: 2024-10-24 — End: ?

## 2024-12-06 ENCOUNTER — Encounter: Payer: Self-pay | Admitting: Cardiology

## 2024-12-07 MED ORDER — EZETIMIBE 10 MG PO TABS: 10.0000 mg | ORAL_TABLET | Freq: Every day | ORAL | 3 refills | Status: AC
